# Patient Record
Sex: Male | Born: 1950
Health system: Southern US, Community
[De-identification: ages and names within clinical notes are randomized; demographics above are authoritative.]

## PROBLEM LIST (undated history)

## (undated) DIAGNOSIS — E7800 Pure hypercholesterolemia, unspecified: Secondary | ICD-10-CM

## (undated) DIAGNOSIS — I10 Essential (primary) hypertension: Secondary | ICD-10-CM

## (undated) DIAGNOSIS — K56609 Unspecified intestinal obstruction, unspecified as to partial versus complete obstruction: Secondary | ICD-10-CM

## (undated) DIAGNOSIS — E119 Type 2 diabetes mellitus without complications: Secondary | ICD-10-CM

## (undated) DIAGNOSIS — I4892 Unspecified atrial flutter: Secondary | ICD-10-CM

---

## 2006-06-14 ENCOUNTER — Ambulatory Visit (HOSPITAL_BASED_OUTPATIENT_CLINIC_OR_DEPARTMENT_OTHER): Admission: RE | Admit: 2006-06-14 | Discharge: 2006-06-14 | Payer: Self-pay | Admitting: Orthopedic Surgery

## 2015-11-17 DIAGNOSIS — E119 Type 2 diabetes mellitus without complications: Secondary | ICD-10-CM | POA: Diagnosis not present

## 2015-11-24 DIAGNOSIS — J019 Acute sinusitis, unspecified: Secondary | ICD-10-CM | POA: Diagnosis not present

## 2015-11-24 DIAGNOSIS — Z6829 Body mass index (BMI) 29.0-29.9, adult: Secondary | ICD-10-CM | POA: Diagnosis not present

## 2015-12-19 DIAGNOSIS — R69 Illness, unspecified: Secondary | ICD-10-CM | POA: Diagnosis not present

## 2015-12-28 DIAGNOSIS — R93421 Abnormal radiologic findings on diagnostic imaging of right kidney: Secondary | ICD-10-CM | POA: Diagnosis not present

## 2015-12-28 DIAGNOSIS — N281 Cyst of kidney, acquired: Secondary | ICD-10-CM | POA: Diagnosis not present

## 2015-12-28 DIAGNOSIS — N200 Calculus of kidney: Secondary | ICD-10-CM | POA: Diagnosis not present

## 2015-12-28 DIAGNOSIS — Z87442 Personal history of urinary calculi: Secondary | ICD-10-CM | POA: Diagnosis not present

## 2015-12-28 DIAGNOSIS — M8580 Other specified disorders of bone density and structure, unspecified site: Secondary | ICD-10-CM | POA: Diagnosis not present

## 2015-12-28 DIAGNOSIS — R93422 Abnormal radiologic findings on diagnostic imaging of left kidney: Secondary | ICD-10-CM | POA: Diagnosis not present

## 2015-12-28 DIAGNOSIS — Z09 Encounter for follow-up examination after completed treatment for conditions other than malignant neoplasm: Secondary | ICD-10-CM | POA: Diagnosis not present

## 2015-12-28 DIAGNOSIS — Z6827 Body mass index (BMI) 27.0-27.9, adult: Secondary | ICD-10-CM | POA: Diagnosis not present

## 2015-12-31 DIAGNOSIS — R69 Illness, unspecified: Secondary | ICD-10-CM | POA: Diagnosis not present

## 2016-01-21 DIAGNOSIS — R69 Illness, unspecified: Secondary | ICD-10-CM | POA: Diagnosis not present

## 2016-01-27 DIAGNOSIS — N200 Calculus of kidney: Secondary | ICD-10-CM | POA: Diagnosis not present

## 2016-01-27 DIAGNOSIS — Z1389 Encounter for screening for other disorder: Secondary | ICD-10-CM | POA: Diagnosis not present

## 2016-01-27 DIAGNOSIS — R319 Hematuria, unspecified: Secondary | ICD-10-CM | POA: Diagnosis not present

## 2016-01-28 DIAGNOSIS — R93422 Abnormal radiologic findings on diagnostic imaging of left kidney: Secondary | ICD-10-CM | POA: Diagnosis not present

## 2016-01-28 DIAGNOSIS — N281 Cyst of kidney, acquired: Secondary | ICD-10-CM | POA: Diagnosis not present

## 2016-02-18 DIAGNOSIS — N200 Calculus of kidney: Secondary | ICD-10-CM | POA: Diagnosis not present

## 2016-02-25 DIAGNOSIS — R69 Illness, unspecified: Secondary | ICD-10-CM | POA: Diagnosis not present

## 2016-04-12 DIAGNOSIS — Z7984 Long term (current) use of oral hypoglycemic drugs: Secondary | ICD-10-CM | POA: Diagnosis not present

## 2016-04-12 DIAGNOSIS — M109 Gout, unspecified: Secondary | ICD-10-CM | POA: Diagnosis not present

## 2016-04-12 DIAGNOSIS — I10 Essential (primary) hypertension: Secondary | ICD-10-CM | POA: Diagnosis not present

## 2016-04-12 DIAGNOSIS — N200 Calculus of kidney: Secondary | ICD-10-CM | POA: Diagnosis not present

## 2016-04-12 DIAGNOSIS — K5190 Ulcerative colitis, unspecified, without complications: Secondary | ICD-10-CM | POA: Diagnosis not present

## 2016-04-12 DIAGNOSIS — E119 Type 2 diabetes mellitus without complications: Secondary | ICD-10-CM | POA: Diagnosis not present

## 2016-04-25 DIAGNOSIS — R69 Illness, unspecified: Secondary | ICD-10-CM | POA: Diagnosis not present

## 2016-05-06 DIAGNOSIS — I10 Essential (primary) hypertension: Secondary | ICD-10-CM | POA: Diagnosis not present

## 2016-05-06 DIAGNOSIS — E559 Vitamin D deficiency, unspecified: Secondary | ICD-10-CM | POA: Diagnosis not present

## 2016-05-06 DIAGNOSIS — Z125 Encounter for screening for malignant neoplasm of prostate: Secondary | ICD-10-CM | POA: Diagnosis not present

## 2016-05-06 DIAGNOSIS — E1140 Type 2 diabetes mellitus with diabetic neuropathy, unspecified: Secondary | ICD-10-CM | POA: Diagnosis not present

## 2016-05-06 DIAGNOSIS — Z9181 History of falling: Secondary | ICD-10-CM | POA: Diagnosis not present

## 2016-05-06 DIAGNOSIS — Z1389 Encounter for screening for other disorder: Secondary | ICD-10-CM | POA: Diagnosis not present

## 2016-05-06 DIAGNOSIS — E785 Hyperlipidemia, unspecified: Secondary | ICD-10-CM | POA: Diagnosis not present

## 2016-05-31 DIAGNOSIS — Z8719 Personal history of other diseases of the digestive system: Secondary | ICD-10-CM | POA: Diagnosis not present

## 2016-05-31 DIAGNOSIS — Z6829 Body mass index (BMI) 29.0-29.9, adult: Secondary | ICD-10-CM | POA: Diagnosis not present

## 2016-06-01 DIAGNOSIS — R69 Illness, unspecified: Secondary | ICD-10-CM | POA: Diagnosis not present

## 2016-06-30 DIAGNOSIS — J019 Acute sinusitis, unspecified: Secondary | ICD-10-CM | POA: Diagnosis not present

## 2016-06-30 DIAGNOSIS — Z6830 Body mass index (BMI) 30.0-30.9, adult: Secondary | ICD-10-CM | POA: Diagnosis not present

## 2016-08-10 DIAGNOSIS — N281 Cyst of kidney, acquired: Secondary | ICD-10-CM | POA: Diagnosis not present

## 2016-08-10 DIAGNOSIS — N200 Calculus of kidney: Secondary | ICD-10-CM | POA: Diagnosis not present

## 2016-08-15 DIAGNOSIS — R69 Illness, unspecified: Secondary | ICD-10-CM | POA: Diagnosis not present

## 2016-08-17 DIAGNOSIS — N200 Calculus of kidney: Secondary | ICD-10-CM | POA: Diagnosis not present

## 2016-08-23 DIAGNOSIS — Z9049 Acquired absence of other specified parts of digestive tract: Secondary | ICD-10-CM | POA: Diagnosis not present

## 2016-08-23 DIAGNOSIS — K429 Umbilical hernia without obstruction or gangrene: Secondary | ICD-10-CM | POA: Diagnosis not present

## 2016-08-23 DIAGNOSIS — N202 Calculus of kidney with calculus of ureter: Secondary | ICD-10-CM | POA: Diagnosis not present

## 2016-08-24 DIAGNOSIS — N200 Calculus of kidney: Secondary | ICD-10-CM | POA: Diagnosis not present

## 2016-10-20 DIAGNOSIS — N200 Calculus of kidney: Secondary | ICD-10-CM | POA: Diagnosis not present

## 2016-11-02 DIAGNOSIS — I10 Essential (primary) hypertension: Secondary | ICD-10-CM | POA: Diagnosis not present

## 2016-11-02 DIAGNOSIS — N183 Chronic kidney disease, stage 3 (moderate): Secondary | ICD-10-CM | POA: Diagnosis not present

## 2016-11-02 DIAGNOSIS — E1122 Type 2 diabetes mellitus with diabetic chronic kidney disease: Secondary | ICD-10-CM | POA: Diagnosis not present

## 2016-11-02 DIAGNOSIS — Z7984 Long term (current) use of oral hypoglycemic drugs: Secondary | ICD-10-CM | POA: Diagnosis not present

## 2016-11-02 DIAGNOSIS — M109 Gout, unspecified: Secondary | ICD-10-CM | POA: Diagnosis not present

## 2016-11-02 DIAGNOSIS — K5190 Ulcerative colitis, unspecified, without complications: Secondary | ICD-10-CM | POA: Diagnosis not present

## 2016-11-02 DIAGNOSIS — I129 Hypertensive chronic kidney disease with stage 1 through stage 4 chronic kidney disease, or unspecified chronic kidney disease: Secondary | ICD-10-CM | POA: Diagnosis not present

## 2016-11-02 DIAGNOSIS — E119 Type 2 diabetes mellitus without complications: Secondary | ICD-10-CM | POA: Diagnosis not present

## 2016-11-02 DIAGNOSIS — N200 Calculus of kidney: Secondary | ICD-10-CM | POA: Diagnosis not present

## 2016-11-10 DIAGNOSIS — Z23 Encounter for immunization: Secondary | ICD-10-CM | POA: Diagnosis not present

## 2016-11-10 DIAGNOSIS — Z6829 Body mass index (BMI) 29.0-29.9, adult: Secondary | ICD-10-CM | POA: Diagnosis not present

## 2016-11-10 DIAGNOSIS — E1140 Type 2 diabetes mellitus with diabetic neuropathy, unspecified: Secondary | ICD-10-CM | POA: Diagnosis not present

## 2016-11-10 DIAGNOSIS — N183 Chronic kidney disease, stage 3 (moderate): Secondary | ICD-10-CM | POA: Diagnosis not present

## 2016-11-10 DIAGNOSIS — E785 Hyperlipidemia, unspecified: Secondary | ICD-10-CM | POA: Diagnosis not present

## 2016-11-10 DIAGNOSIS — I129 Hypertensive chronic kidney disease with stage 1 through stage 4 chronic kidney disease, or unspecified chronic kidney disease: Secondary | ICD-10-CM | POA: Diagnosis not present

## 2016-11-10 DIAGNOSIS — E559 Vitamin D deficiency, unspecified: Secondary | ICD-10-CM | POA: Diagnosis not present

## 2016-11-12 DIAGNOSIS — R69 Illness, unspecified: Secondary | ICD-10-CM | POA: Diagnosis not present

## 2016-11-21 DIAGNOSIS — Z6829 Body mass index (BMI) 29.0-29.9, adult: Secondary | ICD-10-CM | POA: Diagnosis not present

## 2016-11-21 DIAGNOSIS — J019 Acute sinusitis, unspecified: Secondary | ICD-10-CM | POA: Diagnosis not present

## 2016-11-29 DIAGNOSIS — H25813 Combined forms of age-related cataract, bilateral: Secondary | ICD-10-CM | POA: Diagnosis not present

## 2016-11-29 DIAGNOSIS — E119 Type 2 diabetes mellitus without complications: Secondary | ICD-10-CM | POA: Diagnosis not present

## 2017-01-26 DIAGNOSIS — R69 Illness, unspecified: Secondary | ICD-10-CM | POA: Diagnosis not present

## 2017-02-15 DIAGNOSIS — R69 Illness, unspecified: Secondary | ICD-10-CM | POA: Diagnosis not present

## 2017-04-18 DIAGNOSIS — R69 Illness, unspecified: Secondary | ICD-10-CM | POA: Diagnosis not present

## 2017-04-25 DIAGNOSIS — R69 Illness, unspecified: Secondary | ICD-10-CM | POA: Diagnosis not present

## 2017-05-15 DIAGNOSIS — N183 Chronic kidney disease, stage 3 (moderate): Secondary | ICD-10-CM | POA: Diagnosis not present

## 2017-05-15 DIAGNOSIS — E785 Hyperlipidemia, unspecified: Secondary | ICD-10-CM | POA: Diagnosis not present

## 2017-05-15 DIAGNOSIS — Z1331 Encounter for screening for depression: Secondary | ICD-10-CM | POA: Diagnosis not present

## 2017-05-15 DIAGNOSIS — Z125 Encounter for screening for malignant neoplasm of prostate: Secondary | ICD-10-CM | POA: Diagnosis not present

## 2017-05-15 DIAGNOSIS — E1140 Type 2 diabetes mellitus with diabetic neuropathy, unspecified: Secondary | ICD-10-CM | POA: Diagnosis not present

## 2017-05-15 DIAGNOSIS — I129 Hypertensive chronic kidney disease with stage 1 through stage 4 chronic kidney disease, or unspecified chronic kidney disease: Secondary | ICD-10-CM | POA: Diagnosis not present

## 2017-05-15 DIAGNOSIS — Z9181 History of falling: Secondary | ICD-10-CM | POA: Diagnosis not present

## 2017-05-15 DIAGNOSIS — Z23 Encounter for immunization: Secondary | ICD-10-CM | POA: Diagnosis not present

## 2017-05-15 DIAGNOSIS — Z6829 Body mass index (BMI) 29.0-29.9, adult: Secondary | ICD-10-CM | POA: Diagnosis not present

## 2017-05-15 DIAGNOSIS — E1165 Type 2 diabetes mellitus with hyperglycemia: Secondary | ICD-10-CM | POA: Diagnosis not present

## 2017-05-15 DIAGNOSIS — E559 Vitamin D deficiency, unspecified: Secondary | ICD-10-CM | POA: Diagnosis not present

## 2017-05-29 DIAGNOSIS — E785 Hyperlipidemia, unspecified: Secondary | ICD-10-CM | POA: Diagnosis not present

## 2017-05-29 DIAGNOSIS — Z125 Encounter for screening for malignant neoplasm of prostate: Secondary | ICD-10-CM | POA: Diagnosis not present

## 2017-05-29 DIAGNOSIS — Z9181 History of falling: Secondary | ICD-10-CM | POA: Diagnosis not present

## 2017-05-29 DIAGNOSIS — Z1331 Encounter for screening for depression: Secondary | ICD-10-CM | POA: Diagnosis not present

## 2017-05-29 DIAGNOSIS — Z131 Encounter for screening for diabetes mellitus: Secondary | ICD-10-CM | POA: Diagnosis not present

## 2017-05-29 DIAGNOSIS — Z0000 Encounter for general adult medical examination without abnormal findings: Secondary | ICD-10-CM | POA: Diagnosis not present

## 2017-06-06 DIAGNOSIS — Z6829 Body mass index (BMI) 29.0-29.9, adult: Secondary | ICD-10-CM | POA: Diagnosis not present

## 2017-06-06 DIAGNOSIS — Z8719 Personal history of other diseases of the digestive system: Secondary | ICD-10-CM | POA: Diagnosis not present

## 2017-07-14 DIAGNOSIS — R69 Illness, unspecified: Secondary | ICD-10-CM | POA: Diagnosis not present

## 2017-08-03 DIAGNOSIS — R69 Illness, unspecified: Secondary | ICD-10-CM | POA: Diagnosis not present

## 2017-09-05 DIAGNOSIS — R69 Illness, unspecified: Secondary | ICD-10-CM | POA: Diagnosis not present

## 2017-10-13 DIAGNOSIS — R69 Illness, unspecified: Secondary | ICD-10-CM | POA: Diagnosis not present

## 2017-11-13 DIAGNOSIS — Z6829 Body mass index (BMI) 29.0-29.9, adult: Secondary | ICD-10-CM | POA: Diagnosis not present

## 2017-11-13 DIAGNOSIS — E1140 Type 2 diabetes mellitus with diabetic neuropathy, unspecified: Secondary | ICD-10-CM | POA: Diagnosis not present

## 2017-11-13 DIAGNOSIS — N183 Chronic kidney disease, stage 3 (moderate): Secondary | ICD-10-CM | POA: Diagnosis not present

## 2017-11-13 DIAGNOSIS — I129 Hypertensive chronic kidney disease with stage 1 through stage 4 chronic kidney disease, or unspecified chronic kidney disease: Secondary | ICD-10-CM | POA: Diagnosis not present

## 2017-11-13 DIAGNOSIS — E559 Vitamin D deficiency, unspecified: Secondary | ICD-10-CM | POA: Diagnosis not present

## 2017-11-13 DIAGNOSIS — E785 Hyperlipidemia, unspecified: Secondary | ICD-10-CM | POA: Diagnosis not present

## 2017-11-23 DIAGNOSIS — R69 Illness, unspecified: Secondary | ICD-10-CM | POA: Diagnosis not present

## 2017-12-08 DIAGNOSIS — E119 Type 2 diabetes mellitus without complications: Secondary | ICD-10-CM | POA: Diagnosis not present

## 2017-12-08 DIAGNOSIS — H25813 Combined forms of age-related cataract, bilateral: Secondary | ICD-10-CM | POA: Diagnosis not present

## 2017-12-14 DIAGNOSIS — E1140 Type 2 diabetes mellitus with diabetic neuropathy, unspecified: Secondary | ICD-10-CM | POA: Diagnosis not present

## 2017-12-14 DIAGNOSIS — E1165 Type 2 diabetes mellitus with hyperglycemia: Secondary | ICD-10-CM | POA: Diagnosis not present

## 2018-02-05 DIAGNOSIS — R69 Illness, unspecified: Secondary | ICD-10-CM | POA: Diagnosis not present

## 2018-02-09 DIAGNOSIS — R69 Illness, unspecified: Secondary | ICD-10-CM | POA: Diagnosis not present

## 2018-03-29 DIAGNOSIS — R69 Illness, unspecified: Secondary | ICD-10-CM | POA: Diagnosis not present

## 2018-05-18 DIAGNOSIS — Z125 Encounter for screening for malignant neoplasm of prostate: Secondary | ICD-10-CM | POA: Diagnosis not present

## 2018-05-18 DIAGNOSIS — N183 Chronic kidney disease, stage 3 (moderate): Secondary | ICD-10-CM | POA: Diagnosis not present

## 2018-05-18 DIAGNOSIS — E785 Hyperlipidemia, unspecified: Secondary | ICD-10-CM | POA: Diagnosis not present

## 2018-05-18 DIAGNOSIS — E1140 Type 2 diabetes mellitus with diabetic neuropathy, unspecified: Secondary | ICD-10-CM | POA: Diagnosis not present

## 2018-05-18 DIAGNOSIS — E559 Vitamin D deficiency, unspecified: Secondary | ICD-10-CM | POA: Diagnosis not present

## 2018-05-21 DIAGNOSIS — E1140 Type 2 diabetes mellitus with diabetic neuropathy, unspecified: Secondary | ICD-10-CM | POA: Diagnosis not present

## 2018-05-21 DIAGNOSIS — E559 Vitamin D deficiency, unspecified: Secondary | ICD-10-CM | POA: Diagnosis not present

## 2018-05-21 DIAGNOSIS — N183 Chronic kidney disease, stage 3 (moderate): Secondary | ICD-10-CM | POA: Diagnosis not present

## 2018-05-21 DIAGNOSIS — I129 Hypertensive chronic kidney disease with stage 1 through stage 4 chronic kidney disease, or unspecified chronic kidney disease: Secondary | ICD-10-CM | POA: Diagnosis not present

## 2018-05-21 DIAGNOSIS — E785 Hyperlipidemia, unspecified: Secondary | ICD-10-CM | POA: Diagnosis not present

## 2018-05-21 DIAGNOSIS — Z6830 Body mass index (BMI) 30.0-30.9, adult: Secondary | ICD-10-CM | POA: Diagnosis not present

## 2018-05-28 DIAGNOSIS — M25521 Pain in right elbow: Secondary | ICD-10-CM | POA: Diagnosis not present

## 2018-05-31 DIAGNOSIS — M25521 Pain in right elbow: Secondary | ICD-10-CM | POA: Diagnosis not present

## 2018-05-31 DIAGNOSIS — S59901A Unspecified injury of right elbow, initial encounter: Secondary | ICD-10-CM | POA: Diagnosis not present

## 2018-05-31 DIAGNOSIS — M7989 Other specified soft tissue disorders: Secondary | ICD-10-CM | POA: Diagnosis not present

## 2018-06-06 DIAGNOSIS — M25521 Pain in right elbow: Secondary | ICD-10-CM | POA: Diagnosis not present

## 2018-06-06 DIAGNOSIS — R69 Illness, unspecified: Secondary | ICD-10-CM | POA: Diagnosis not present

## 2018-06-12 DIAGNOSIS — Z8719 Personal history of other diseases of the digestive system: Secondary | ICD-10-CM | POA: Diagnosis not present

## 2018-06-12 DIAGNOSIS — Z6829 Body mass index (BMI) 29.0-29.9, adult: Secondary | ICD-10-CM | POA: Diagnosis not present

## 2018-06-25 DIAGNOSIS — R69 Illness, unspecified: Secondary | ICD-10-CM | POA: Diagnosis not present

## 2018-08-03 DIAGNOSIS — R69 Illness, unspecified: Secondary | ICD-10-CM | POA: Diagnosis not present

## 2018-08-20 DIAGNOSIS — R69 Illness, unspecified: Secondary | ICD-10-CM | POA: Diagnosis not present

## 2018-09-21 DIAGNOSIS — R69 Illness, unspecified: Secondary | ICD-10-CM | POA: Diagnosis not present

## 2018-11-23 DIAGNOSIS — N183 Chronic kidney disease, stage 3 (moderate): Secondary | ICD-10-CM | POA: Diagnosis not present

## 2018-11-23 DIAGNOSIS — E559 Vitamin D deficiency, unspecified: Secondary | ICD-10-CM | POA: Diagnosis not present

## 2018-11-23 DIAGNOSIS — E785 Hyperlipidemia, unspecified: Secondary | ICD-10-CM | POA: Diagnosis not present

## 2018-11-23 DIAGNOSIS — E1140 Type 2 diabetes mellitus with diabetic neuropathy, unspecified: Secondary | ICD-10-CM | POA: Diagnosis not present

## 2018-11-26 DIAGNOSIS — Z9181 History of falling: Secondary | ICD-10-CM | POA: Diagnosis not present

## 2018-11-26 DIAGNOSIS — Z1331 Encounter for screening for depression: Secondary | ICD-10-CM | POA: Diagnosis not present

## 2018-11-26 DIAGNOSIS — Z0000 Encounter for general adult medical examination without abnormal findings: Secondary | ICD-10-CM | POA: Diagnosis not present

## 2018-11-26 DIAGNOSIS — Z1339 Encounter for screening examination for other mental health and behavioral disorders: Secondary | ICD-10-CM | POA: Diagnosis not present

## 2018-11-26 DIAGNOSIS — Z6830 Body mass index (BMI) 30.0-30.9, adult: Secondary | ICD-10-CM | POA: Diagnosis not present

## 2018-11-26 DIAGNOSIS — E1140 Type 2 diabetes mellitus with diabetic neuropathy, unspecified: Secondary | ICD-10-CM | POA: Diagnosis not present

## 2018-11-26 DIAGNOSIS — I129 Hypertensive chronic kidney disease with stage 1 through stage 4 chronic kidney disease, or unspecified chronic kidney disease: Secondary | ICD-10-CM | POA: Diagnosis not present

## 2018-11-26 DIAGNOSIS — E785 Hyperlipidemia, unspecified: Secondary | ICD-10-CM | POA: Diagnosis not present

## 2018-11-26 DIAGNOSIS — E559 Vitamin D deficiency, unspecified: Secondary | ICD-10-CM | POA: Diagnosis not present

## 2018-11-26 DIAGNOSIS — E669 Obesity, unspecified: Secondary | ICD-10-CM | POA: Diagnosis not present

## 2018-12-20 DIAGNOSIS — E119 Type 2 diabetes mellitus without complications: Secondary | ICD-10-CM | POA: Diagnosis not present

## 2019-03-11 DIAGNOSIS — R69 Illness, unspecified: Secondary | ICD-10-CM | POA: Diagnosis not present

## 2019-04-18 DIAGNOSIS — R69 Illness, unspecified: Secondary | ICD-10-CM | POA: Diagnosis not present

## 2019-05-10 DIAGNOSIS — Z23 Encounter for immunization: Secondary | ICD-10-CM | POA: Diagnosis not present

## 2019-05-31 DIAGNOSIS — E559 Vitamin D deficiency, unspecified: Secondary | ICD-10-CM | POA: Diagnosis not present

## 2019-05-31 DIAGNOSIS — Z125 Encounter for screening for malignant neoplasm of prostate: Secondary | ICD-10-CM | POA: Diagnosis not present

## 2019-05-31 DIAGNOSIS — N183 Chronic kidney disease, stage 3 unspecified: Secondary | ICD-10-CM | POA: Diagnosis not present

## 2019-05-31 DIAGNOSIS — E785 Hyperlipidemia, unspecified: Secondary | ICD-10-CM | POA: Diagnosis not present

## 2019-05-31 DIAGNOSIS — E1140 Type 2 diabetes mellitus with diabetic neuropathy, unspecified: Secondary | ICD-10-CM | POA: Diagnosis not present

## 2019-06-03 DIAGNOSIS — E1165 Type 2 diabetes mellitus with hyperglycemia: Secondary | ICD-10-CM | POA: Diagnosis not present

## 2019-06-03 DIAGNOSIS — Z139 Encounter for screening, unspecified: Secondary | ICD-10-CM | POA: Diagnosis not present

## 2019-06-03 DIAGNOSIS — Z6826 Body mass index (BMI) 26.0-26.9, adult: Secondary | ICD-10-CM | POA: Diagnosis not present

## 2019-06-03 DIAGNOSIS — E559 Vitamin D deficiency, unspecified: Secondary | ICD-10-CM | POA: Diagnosis not present

## 2019-06-03 DIAGNOSIS — I129 Hypertensive chronic kidney disease with stage 1 through stage 4 chronic kidney disease, or unspecified chronic kidney disease: Secondary | ICD-10-CM | POA: Diagnosis not present

## 2019-06-03 DIAGNOSIS — E785 Hyperlipidemia, unspecified: Secondary | ICD-10-CM | POA: Diagnosis not present

## 2019-06-03 DIAGNOSIS — N183 Chronic kidney disease, stage 3 unspecified: Secondary | ICD-10-CM | POA: Diagnosis not present

## 2019-06-03 DIAGNOSIS — E1140 Type 2 diabetes mellitus with diabetic neuropathy, unspecified: Secondary | ICD-10-CM | POA: Diagnosis not present

## 2019-06-17 DIAGNOSIS — Z8719 Personal history of other diseases of the digestive system: Secondary | ICD-10-CM | POA: Diagnosis not present

## 2019-06-17 DIAGNOSIS — Z6825 Body mass index (BMI) 25.0-25.9, adult: Secondary | ICD-10-CM | POA: Diagnosis not present

## 2019-09-16 DIAGNOSIS — M79671 Pain in right foot: Secondary | ICD-10-CM | POA: Diagnosis not present

## 2019-09-16 DIAGNOSIS — R309 Painful micturition, unspecified: Secondary | ICD-10-CM | POA: Diagnosis not present

## 2019-09-18 DIAGNOSIS — R1032 Left lower quadrant pain: Secondary | ICD-10-CM | POA: Diagnosis not present

## 2019-09-20 DIAGNOSIS — S76212A Strain of adductor muscle, fascia and tendon of left thigh, initial encounter: Secondary | ICD-10-CM | POA: Diagnosis not present

## 2019-09-24 DIAGNOSIS — Z20828 Contact with and (suspected) exposure to other viral communicable diseases: Secondary | ICD-10-CM | POA: Diagnosis not present

## 2019-09-24 DIAGNOSIS — K4090 Unilateral inguinal hernia, without obstruction or gangrene, not specified as recurrent: Secondary | ICD-10-CM | POA: Diagnosis not present

## 2019-09-24 DIAGNOSIS — J3489 Other specified disorders of nose and nasal sinuses: Secondary | ICD-10-CM | POA: Diagnosis not present

## 2019-09-24 DIAGNOSIS — R1030 Lower abdominal pain, unspecified: Secondary | ICD-10-CM | POA: Diagnosis not present

## 2019-09-26 DIAGNOSIS — I129 Hypertensive chronic kidney disease with stage 1 through stage 4 chronic kidney disease, or unspecified chronic kidney disease: Secondary | ICD-10-CM | POA: Diagnosis not present

## 2019-09-26 DIAGNOSIS — G4733 Obstructive sleep apnea (adult) (pediatric): Secondary | ICD-10-CM | POA: Diagnosis not present

## 2019-09-26 DIAGNOSIS — K589 Irritable bowel syndrome without diarrhea: Secondary | ICD-10-CM | POA: Diagnosis not present

## 2019-09-26 DIAGNOSIS — E1122 Type 2 diabetes mellitus with diabetic chronic kidney disease: Secondary | ICD-10-CM | POA: Diagnosis not present

## 2019-09-26 DIAGNOSIS — Z794 Long term (current) use of insulin: Secondary | ICD-10-CM | POA: Diagnosis not present

## 2019-09-26 DIAGNOSIS — K219 Gastro-esophageal reflux disease without esophagitis: Secondary | ICD-10-CM | POA: Diagnosis not present

## 2019-09-26 DIAGNOSIS — E119 Type 2 diabetes mellitus without complications: Secondary | ICD-10-CM | POA: Diagnosis not present

## 2019-09-26 DIAGNOSIS — Z7982 Long term (current) use of aspirin: Secondary | ICD-10-CM | POA: Diagnosis not present

## 2019-09-26 DIAGNOSIS — K4090 Unilateral inguinal hernia, without obstruction or gangrene, not specified as recurrent: Secondary | ICD-10-CM | POA: Diagnosis not present

## 2019-09-26 DIAGNOSIS — N183 Chronic kidney disease, stage 3 unspecified: Secondary | ICD-10-CM | POA: Diagnosis not present

## 2019-09-26 DIAGNOSIS — Z79899 Other long term (current) drug therapy: Secondary | ICD-10-CM | POA: Diagnosis not present

## 2019-09-26 DIAGNOSIS — D176 Benign lipomatous neoplasm of spermatic cord: Secondary | ICD-10-CM | POA: Diagnosis not present

## 2019-10-04 DIAGNOSIS — Z09 Encounter for follow-up examination after completed treatment for conditions other than malignant neoplasm: Secondary | ICD-10-CM | POA: Diagnosis not present

## 2019-10-16 DIAGNOSIS — R69 Illness, unspecified: Secondary | ICD-10-CM | POA: Diagnosis not present

## 2019-12-03 DIAGNOSIS — Z9181 History of falling: Secondary | ICD-10-CM | POA: Diagnosis not present

## 2019-12-03 DIAGNOSIS — E785 Hyperlipidemia, unspecified: Secondary | ICD-10-CM | POA: Diagnosis not present

## 2019-12-03 DIAGNOSIS — N183 Chronic kidney disease, stage 3 unspecified: Secondary | ICD-10-CM | POA: Diagnosis not present

## 2019-12-03 DIAGNOSIS — E1140 Type 2 diabetes mellitus with diabetic neuropathy, unspecified: Secondary | ICD-10-CM | POA: Diagnosis not present

## 2019-12-03 DIAGNOSIS — Z1331 Encounter for screening for depression: Secondary | ICD-10-CM | POA: Diagnosis not present

## 2019-12-03 DIAGNOSIS — Z0000 Encounter for general adult medical examination without abnormal findings: Secondary | ICD-10-CM | POA: Diagnosis not present

## 2019-12-05 DIAGNOSIS — Z6827 Body mass index (BMI) 27.0-27.9, adult: Secondary | ICD-10-CM | POA: Diagnosis not present

## 2019-12-05 DIAGNOSIS — I129 Hypertensive chronic kidney disease with stage 1 through stage 4 chronic kidney disease, or unspecified chronic kidney disease: Secondary | ICD-10-CM | POA: Diagnosis not present

## 2019-12-05 DIAGNOSIS — E785 Hyperlipidemia, unspecified: Secondary | ICD-10-CM | POA: Diagnosis not present

## 2019-12-05 DIAGNOSIS — N183 Chronic kidney disease, stage 3 unspecified: Secondary | ICD-10-CM | POA: Diagnosis not present

## 2019-12-05 DIAGNOSIS — E1165 Type 2 diabetes mellitus with hyperglycemia: Secondary | ICD-10-CM | POA: Diagnosis not present

## 2019-12-05 DIAGNOSIS — E559 Vitamin D deficiency, unspecified: Secondary | ICD-10-CM | POA: Diagnosis not present

## 2019-12-05 DIAGNOSIS — E1140 Type 2 diabetes mellitus with diabetic neuropathy, unspecified: Secondary | ICD-10-CM | POA: Diagnosis not present

## 2019-12-24 DIAGNOSIS — H25813 Combined forms of age-related cataract, bilateral: Secondary | ICD-10-CM | POA: Diagnosis not present

## 2019-12-24 DIAGNOSIS — E119 Type 2 diabetes mellitus without complications: Secondary | ICD-10-CM | POA: Diagnosis not present

## 2020-04-03 DIAGNOSIS — Z20822 Contact with and (suspected) exposure to covid-19: Secondary | ICD-10-CM | POA: Diagnosis not present

## 2020-08-13 ENCOUNTER — Inpatient Hospital Stay (HOSPITAL_COMMUNITY)
Admission: EM | Admit: 2020-08-13 | Discharge: 2020-08-18 | DRG: 176 | Disposition: A | Discharge status: Home / Self Care | Payer: Medicare HMO | Attending: Internal Medicine | Admitting: Internal Medicine

## 2020-08-13 ENCOUNTER — Emergency Department (HOSPITAL_COMMUNITY): Payer: Medicare HMO

## 2020-08-13 ENCOUNTER — Encounter (HOSPITAL_COMMUNITY): Payer: Self-pay | Admitting: Emergency Medicine

## 2020-08-13 DIAGNOSIS — I2721 Secondary pulmonary arterial hypertension: Secondary | ICD-10-CM | POA: Diagnosis present

## 2020-08-13 DIAGNOSIS — E7800 Pure hypercholesterolemia, unspecified: Secondary | ICD-10-CM | POA: Diagnosis present

## 2020-08-13 DIAGNOSIS — E119 Type 2 diabetes mellitus without complications: Secondary | ICD-10-CM

## 2020-08-13 DIAGNOSIS — M549 Dorsalgia, unspecified: Secondary | ICD-10-CM | POA: Diagnosis not present

## 2020-08-13 DIAGNOSIS — I10 Essential (primary) hypertension: Secondary | ICD-10-CM | POA: Diagnosis not present

## 2020-08-13 DIAGNOSIS — N179 Acute kidney failure, unspecified: Secondary | ICD-10-CM

## 2020-08-13 DIAGNOSIS — Z79899 Other long term (current) drug therapy: Secondary | ICD-10-CM

## 2020-08-13 DIAGNOSIS — J9811 Atelectasis: Secondary | ICD-10-CM | POA: Diagnosis not present

## 2020-08-13 DIAGNOSIS — Z9049 Acquired absence of other specified parts of digestive tract: Secondary | ICD-10-CM

## 2020-08-13 DIAGNOSIS — R079 Chest pain, unspecified: Secondary | ICD-10-CM | POA: Diagnosis not present

## 2020-08-13 DIAGNOSIS — E785 Hyperlipidemia, unspecified: Secondary | ICD-10-CM | POA: Diagnosis present

## 2020-08-13 DIAGNOSIS — I2699 Other pulmonary embolism without acute cor pulmonale: Secondary | ICD-10-CM

## 2020-08-13 DIAGNOSIS — R55 Syncope and collapse: Secondary | ICD-10-CM

## 2020-08-13 DIAGNOSIS — I4891 Unspecified atrial fibrillation: Secondary | ICD-10-CM | POA: Diagnosis not present

## 2020-08-13 DIAGNOSIS — Z20822 Contact with and (suspected) exposure to covid-19: Secondary | ICD-10-CM | POA: Diagnosis present

## 2020-08-13 DIAGNOSIS — Z888 Allergy status to other drugs, medicaments and biological substances status: Secondary | ICD-10-CM

## 2020-08-13 DIAGNOSIS — U099 Post covid-19 condition, unspecified: Secondary | ICD-10-CM | POA: Diagnosis present

## 2020-08-13 DIAGNOSIS — I483 Typical atrial flutter: Secondary | ICD-10-CM | POA: Diagnosis not present

## 2020-08-13 DIAGNOSIS — E1122 Type 2 diabetes mellitus with diabetic chronic kidney disease: Secondary | ICD-10-CM | POA: Diagnosis present

## 2020-08-13 DIAGNOSIS — U071 COVID-19: Secondary | ICD-10-CM | POA: Diagnosis not present

## 2020-08-13 DIAGNOSIS — Z8719 Personal history of other diseases of the digestive system: Secondary | ICD-10-CM

## 2020-08-13 DIAGNOSIS — I4892 Unspecified atrial flutter: Secondary | ICD-10-CM | POA: Diagnosis present

## 2020-08-13 DIAGNOSIS — Z743 Need for continuous supervision: Secondary | ICD-10-CM | POA: Diagnosis not present

## 2020-08-13 DIAGNOSIS — Z8249 Family history of ischemic heart disease and other diseases of the circulatory system: Secondary | ICD-10-CM

## 2020-08-13 DIAGNOSIS — I4430 Unspecified atrioventricular block: Secondary | ICD-10-CM | POA: Diagnosis present

## 2020-08-13 DIAGNOSIS — R52 Pain, unspecified: Secondary | ICD-10-CM | POA: Diagnosis not present

## 2020-08-13 DIAGNOSIS — M109 Gout, unspecified: Secondary | ICD-10-CM | POA: Diagnosis present

## 2020-08-13 DIAGNOSIS — D72829 Elevated white blood cell count, unspecified: Secondary | ICD-10-CM | POA: Diagnosis present

## 2020-08-13 DIAGNOSIS — Z87442 Personal history of urinary calculi: Secondary | ICD-10-CM

## 2020-08-13 DIAGNOSIS — I2692 Saddle embolus of pulmonary artery without acute cor pulmonale: Secondary | ICD-10-CM | POA: Diagnosis not present

## 2020-08-13 DIAGNOSIS — N183 Chronic kidney disease, stage 3 unspecified: Secondary | ICD-10-CM | POA: Diagnosis present

## 2020-08-13 DIAGNOSIS — I130 Hypertensive heart and chronic kidney disease with heart failure and stage 1 through stage 4 chronic kidney disease, or unspecified chronic kidney disease: Secondary | ICD-10-CM | POA: Diagnosis present

## 2020-08-13 HISTORY — DX: Type 2 diabetes mellitus without complications: E11.9

## 2020-08-13 HISTORY — DX: Essential (primary) hypertension: I10

## 2020-08-13 HISTORY — DX: Pure hypercholesterolemia, unspecified: E78.00

## 2020-08-13 LAB — CBC
HCT: 49.9 % (ref 39.0–52.0)
Hemoglobin: 16.5 g/dL (ref 13.0–17.0)
MCH: 28.9 pg (ref 26.0–34.0)
MCHC: 33.1 g/dL (ref 30.0–36.0)
MCV: 87.4 fL (ref 80.0–100.0)
Platelets: 141 10*3/uL — ABNORMAL LOW (ref 150–400)
RBC: 5.71 MIL/uL (ref 4.22–5.81)
RDW: 13.4 % (ref 11.5–15.5)
WBC: 13.6 10*3/uL — ABNORMAL HIGH (ref 4.0–10.5)
nRBC: 0 % (ref 0.0–0.2)

## 2020-08-13 LAB — CBG MONITORING, ED: Glucose-Capillary: 142 mg/dL — ABNORMAL HIGH (ref 70–99)

## 2020-08-13 LAB — BASIC METABOLIC PANEL
Anion gap: 13 (ref 5–15)
BUN: 26 mg/dL — ABNORMAL HIGH (ref 8–23)
CO2: 24 mmol/L (ref 22–32)
Calcium: 9.5 mg/dL (ref 8.9–10.3)
Chloride: 101 mmol/L (ref 98–111)
Creatinine, Ser: 1.74 mg/dL — ABNORMAL HIGH (ref 0.61–1.24)
GFR, Estimated: 42 mL/min — ABNORMAL LOW (ref >60)
Glucose, Bld: 159 mg/dL — ABNORMAL HIGH (ref 70–99)
Potassium: 3.9 mmol/L (ref 3.5–5.1)
Sodium: 138 mmol/L (ref 135–145)

## 2020-08-13 LAB — TROPONIN I (HIGH SENSITIVITY)
Troponin I (High Sensitivity): 59 ng/L — ABNORMAL HIGH (ref <18)
Troponin I (High Sensitivity): 156 ng/L (ref <18)
Troponin I (High Sensitivity): 260 ng/L (ref <18)

## 2020-08-13 LAB — SARS CORONAVIRUS 2 BY RT PCR (HOSPITAL ORDER, PERFORMED IN ~~LOC~~ HOSPITAL LAB): SARS Coronavirus 2: POSITIVE — AB

## 2020-08-13 MED ORDER — ACETAMINOPHEN 325 MG PO TABS: 650.0000 mg | ORAL_TABLET | ORAL | Status: DC | PRN

## 2020-08-13 MED ORDER — ONDANSETRON HCL 4 MG/2ML IJ SOLN: 4.0000 mg | Freq: Four times a day (QID) | INTRAMUSCULAR | Status: DC | PRN

## 2020-08-13 MED ORDER — INSULIN GLARGINE 100 UNIT/ML ~~LOC~~ SOLN
15.0000 [IU] | Freq: Every day | SUBCUTANEOUS | Status: DC
  Administered 2020-08-13 – 2020-08-17 (×5): 15 [IU] via SUBCUTANEOUS
  Filled 2020-08-13 (×7): qty 0.15

## 2020-08-13 MED ORDER — INSULIN ASPART 100 UNIT/ML ~~LOC~~ SOLN: 0.0000 [IU] | Freq: Every day | SUBCUTANEOUS | Status: DC

## 2020-08-13 MED ORDER — HEPARIN BOLUS VIA INFUSION
4500.0000 [IU] | Freq: Once | INTRAVENOUS | Status: AC
  Administered 2020-08-13: 4500 [IU] via INTRAVENOUS
  Filled 2020-08-13: qty 4500

## 2020-08-13 MED ORDER — HEPARIN (PORCINE) 25000 UT/250ML-% IV SOLN
1500.0000 [IU]/h | INTRAVENOUS | Status: AC
  Administered 2020-08-13 – 2020-08-14 (×2): 1300 [IU]/h via INTRAVENOUS
  Administered 2020-08-15: 1350 [IU]/h via INTRAVENOUS
  Administered 2020-08-16 (×2): 1500 [IU]/h via INTRAVENOUS
  Filled 2020-08-13 (×6): qty 250

## 2020-08-13 MED ORDER — INSULIN ASPART 100 UNIT/ML ~~LOC~~ SOLN
0.0000 [IU] | Freq: Three times a day (TID) | SUBCUTANEOUS | Status: DC
  Administered 2020-08-14: 2 [IU] via SUBCUTANEOUS
  Administered 2020-08-15: 3 [IU] via SUBCUTANEOUS
  Administered 2020-08-16 – 2020-08-17 (×3): 2 [IU] via SUBCUTANEOUS
  Administered 2020-08-17: 3 [IU] via SUBCUTANEOUS

## 2020-08-13 MED ORDER — EZETIMIBE 10 MG PO TABS
10.0000 mg | ORAL_TABLET | Freq: Every day | ORAL | Status: DC
  Administered 2020-08-14 – 2020-08-18 (×5): 10 mg via ORAL
  Filled 2020-08-13 (×5): qty 1

## 2020-08-13 MED ORDER — PRAVASTATIN SODIUM 40 MG PO TABS
20.0000 mg | ORAL_TABLET | ORAL | Status: DC
  Administered 2020-08-16: 20 mg via ORAL
  Filled 2020-08-13: qty 2

## 2020-08-13 MED ORDER — PANTOPRAZOLE SODIUM 40 MG PO TBEC
40.0000 mg | DELAYED_RELEASE_TABLET | Freq: Every day | ORAL | Status: DC
  Administered 2020-08-14 – 2020-08-18 (×5): 40 mg via ORAL
  Filled 2020-08-13 (×5): qty 1

## 2020-08-13 MED ORDER — LACTATED RINGERS IV SOLN: INTRAVENOUS | Status: AC

## 2020-08-13 NOTE — ED Notes (Addendum)
Did not do clinical pathway and patient education. Accidentally clicked off

## 2020-08-13 NOTE — ED Triage Notes (Signed)
Pt was outside walking in a grave yard for exercise and suddenly became to feel faint, diaphoretic and laid down and called ems. Unsure if he had a syncope eent on ems arrival he was awake but pale and very weak and found to be in a flutter rate 100-140's. No history.

## 2020-08-13 NOTE — ED Notes (Signed)
PT HAS DOCUMENTATION FROM [facility] ON 07/20/20 OF POSITIVE COVID. HE IS PAST ISOLATION STATUS. Modena Nunnery, MD MADE AWARE OF THE SAME.

## 2020-08-13 NOTE — Progress Notes (Signed)
ANTICOAGULATION CONSULT NOTE - Initial Consult  Pharmacy Consult for heparin Indication: atrial fibrillation  Allergies  Allergen Reactions   Indomethacin Diarrhea    Other reaction(s): Other (See Comments) "caused ulcerative colitis;bleeding ulcers"     Patient Measurements: Height: 5\' 11"  (180.3 cm) Weight: 90.7 kg (200 lb) IBW/kg (Calculated) : 75.3 Heparin Dosing Weight: 90.7  Vital Signs: Temp: 97.6 F (36.4 C) (02/03 1701) Temp Source: Oral (02/03 1701) BP: 128/96 (02/03 1830) Pulse Rate: 95 (02/03 1830)  Labs: Recent Labs    08/13/20 1549  HGB 16.5  HCT 49.9  PLT 141*  CREATININE 1.74*  TROPONINIHS 59*    Estimated Creatinine Clearance: 46.2 mL/min (A) (by C-G formula based on SCr of 1.74 mg/dL (H)).   Medical History: Past Medical History:  Diagnosis Date   Diabetes mellitus without complication (HCC)    High cholesterol    Hypertension     Medications:  No AC PTA  Assessment: 70 yo male admitted for near syncope.  EMS was called and patient found to have atrial flutter with heart rate up to 140.  Patient does not have history of atrial flutter or atrial fibrillation.  No anticoagulation PTA.  Patient did state he felt his heart beating a little differently a few months ago- likely not acute atrial fibrillation.  Cardiology following.  Per patient, he is 5'11" and weighs 200lb.   Goal of Therapy:  Heparin level 0.3-0.7 units/ml Monitor platelets by anticoagulation protocol: Yes   Plan:  Heparin 4500 unit bolus x1, then Initiate heparin drip at 1300 units/hr F/u 8 hour HL Monitor s/sx bleeding, daily CBC F/u cardiology plans if oral [facility]  Dimple Nanas, PharmD PGY-1 Acute Care Pharmacy Resident Office: [phone] 08/13/2020 6:55 PM

## 2020-08-13 NOTE — ED Provider Notes (Signed)
[facility] EMERGENCY DEPARTMENT Provider Note   CSN: [id number] Arrival date & time: 08/13/20  1534     History Chief Complaint  Patient presents with  . Near Syncope    Bobby Fields is a 70 y.o. male.  HPI Patient has history of diabetes and hypertension.  Was walking [place] gra[place] which is typical for him.  States he then began to suddenly feel lightheaded.  Felt as if he could pass out.  Went to the ground.  May have had a brief period of passing out but not sure.  Did not have chest pain but was reportedly pale and weak.  EMS was called and found to have atrial flutter with a heart rate of up to 140.  No history of A. fib/flutter.  Feeling better now.  States has had URI symptoms for the last few weeks.  Did have Covid a month ago.  States he did previously back in a few months ago feels heartbeat a little differently at that time to.  States he did take some cough medicine today.    Past Medical History:  Diagnosis Date  . Diabetes mellitus without complication ([place])   . High cholesterol   . Hypertension     There are no problems to display for this patient.   History reviewed. No pertinent surgical history.     No family history on file.     Home Medications Prior to Admission medications   Not on File    Allergies    Indomethacin  Review of Systems   Review of Systems  Constitutional: Positive for fatigue. Negative for appetite change and unexpected weight change.  HENT: Negative for congestion.   Respiratory: Negative for shortness of breath.   Cardiovascular: Positive for palpitations. Negative for chest pain.  Gastrointestinal: Negative for abdominal pain.  Genitourinary: Negative for flank pain.  Musculoskeletal: Negative for back pain.  Skin: Negative for rash.  Neurological: Positive for light-headedness.  Psychiatric/Behavioral: Negative for confusion.    Physical Exam Updated Vital Signs BP (!) 131/108   Pulse 96    Temp 97.6 F (36.4 C) (Oral)   Resp (!) 24   Ht 5\' 11"  (1.803 m)   Wt 90.7 kg   SpO2 98%   BMI 27.89 kg/m   Physical Exam Vitals and nursing note reviewed.  HENT:     Head: Normocephalic.  Eyes:     Pupils: Pupils are equal, round, and reactive to light.  Cardiovascular:     Rate and Rhythm: Normal rate and regular rhythm.  Pulmonary:     Breath sounds: No wheezing, rhonchi or rales.  Abdominal:     Tenderness: There is no abdominal tenderness.  Musculoskeletal:        General: No tenderness.     Cervical back: Neck supple.  Skin:    General: Skin is warm.     Capillary Refill: Capillary refill takes less than 2 seconds.  Neurological:     Mental Status: He is alert and oriented to person, place, and time.     ED Results / Procedures / Treatments   Labs (all labs ordered are listed, but only abnormal results are displayed) Labs Reviewed  BASIC METABOLIC PANEL - Abnormal; Notable for the following components:      Result Value   Glucose, Bld 159 (*)    BUN 26 (*)    Creatinine, Ser 1.74 (*)    GFR, Estimated 42 (*)    All other components within  normal limits  CBC - Abnormal; Notable for the following components:   WBC 13.6 (*)    Platelets 141 (*)    All other components within normal limits  TROPONIN I (HIGH SENSITIVITY) - Abnormal; Notable for the following components:   Troponin I (High Sensitivity) 59 (*)    All other components within normal limits  TSH  HEPARIN LEVEL (UNFRACTIONATED)  CBC  TROPONIN I (HIGH SENSITIVITY)    EKG EKG Interpretation  Date/Time:  Thursday August 13 2020 15:36:42 EST Ventricular Rate:  125 PR Interval:    QRS Duration: 80 QT Interval:  430 QTC Calculation: 620 R Axis:   86 Text Interpretation:  Critical Test Result: Long QTc Atrial flutter with variable A-V block Nonspecific ST abnormality Abnormal ECG Confirmed by Davonna Belling [phone]) on 08/13/2020 5:07:57 PM   Radiology DG Chest 2 View  Result Date:  08/13/2020 CLINICAL DATA:  Chest pain.  Presyncope. EXAM: CHEST - 2 VIEW COMPARISON:  None. FINDINGS: The cardiomediastinal contours are normal. Subsegmental bibasilar atelectasis. Pulmonary vasculature is normal. No consolidation, pleural effusion, or pneumothorax. No acute osseous abnormalities are seen. IMPRESSION: Subsegmental bibasilar atelectasis. Electronically Signed   By: Keith Rake M.D.   On: 08/13/2020 16:26    Procedures Procedures   Medications Ordered in ED Medications  heparin bolus via infusion 4,500 Units (has no administration in time range)  heparin ADULT infusion 100 units/mL (25000 units/259mL) (has no administration in time range)    ED Course  I have reviewed the triage vital signs and the nursing notes.  Pertinent labs & imaging results that were available during my care of the patient were reviewed by me and considered in my medical decision making (see chart for details).    MDM Rules/Calculators/A&P                          Patient presents with near syncopal episode.  Found to be in atrial flutter which patient was not previously diagnosed with.  Unsure of time of onset however.  Began to feel bad acutely today but now that his heart rate is controlled does not feel any different than his baseline.  Also had an episode few months ago where he felt his heart beating little strangely.  I do not think we can say this is an acute onset of the A. fib in under 48 hours where he will be a good candidate for ER cardioversion.  Also particularly with the near syncope and mildly elevated troponin feel the patient benefit for admission to the hospital to the cardiology service.  Will discuss with cardiologist  Cardiology is seen patient and recommends medicine admission due to diabetes and potentially acute kidney injury.  Will discuss with unassigned medicine since patient's PCP is in [facility]  Final Clinical Impression(s) / ED Diagnoses Final diagnoses:   Atrial flutter with rapid ventricular response ([place])  Near syncope    Rx / DC Orders ED Discharge Orders    None       Davonna Belling, MD 08/13/20 1858

## 2020-08-13 NOTE — H&P (Addendum)
Date: 08/13/2020               Patient Name:  Bobby Fields MRN: [id number]  DOB: 1951/06/06 Age / Sex: 70 y.o., male   PCP: Nicoletta Dress, MD         Medical Service: Internal Medicine Teaching Service         Attending Physician: Dr. Dareen Piano    First Contact: Dr. Alfonse Spruce Pager: [phone]  Second Contact: Dr. Marva Panda Pager: [phone]       After Hours (After 5p/  First Contact Pager: [phone]  weekends / holidays): Second Contact Pager: [phone]   Chief Complaint: Near-syncope  History of Present Illness:   Bobby Fields is a 70 y.o. gentleman w/ PMHx Type II DM, HTN, HLD, ulcerative colitis s/p total colectomy in 2013 with J pouch reversal at [facility] with hernia, and kidney stones requiring stent placement, and gout who presented to the ED after experiencing near-syncope earlier this evening. He states he was going for a walk with a few friends and had just walked for about 20 minutes. He felt SOB towards the end of his walk and was trying to get to his truck, when he suddenly developed dizziness, generalized weakness, and feelings of pre-syncope. He collapsed and was noted to be pale and diaphoretic by his friends. He does not believe he truly lost consciousness as he was able to hear his friends speaking to him the entire time. Denied feeling any CP or palpitations during this time. Initially thought maybe his blood sugar was low, although checked his sugar which was 98 and increased to 124 by the time EMS arrived. Notes he has not been eating or drinking much due to decreased appetite s/p mild COVID-19 infection 1 month ago. Has not had anything to eat or drink since 10:00am and did not eat or drink much yesterday either. Endorses persistent cough and congestion since COVID, headaches in the back of his neck over the past couple of weeks that improve with extra strength Tylenol, and current pain between his shoulder blades that he states feels muscular. Also endorses chronically cold hands and  feet worse at night, although denies any personal history of arrhythmia, cardiac disease, or thyroid disease and states his DM has been well-controlled recently. Saw Dr. Bettina Gavia in the past due to significant family hx CAD, although stress test was negative. Denies any urinary symptoms or dark or bloody stools recently.   ED Course: Patient brought to the ED by EMS, noted to be in A-Flutter with rates in the 140's that improved to 105, rate controlled without intervention since arrival to the ED. Initial troponins 59 > 156 > 260. Labs otherwise significant for Creatinine 1.74, BUN 26, GFR 42, WBC 13.6, Plt 141, glucose 159. Cardiology consulted, and patient was started on heparin infusion.   Home Medications: Omeprazole 20mg  daily Pravastatin 20mg  once weekly Zetia 10mg  daily Tresiba 20 units nightly Januvia 100mg  daily Glimepiride 4mg  daily Calcium Carb-Cholecalciferol 600-800mg  2 tabs daily  Patient had been on 81mg  ASA daily which was discontinued in November.  Recently started OTC medications for allergic conjunctivitis, although unsure which.  Allergies: Allergies as of 08/13/2020 - Review Complete 08/13/2020  Allergen Reaction Noted   Indomethacin Diarrhea 11/17/2011   Past Medical History:  Diagnosis Date   Diabetes mellitus without complication (HCC)    High cholesterol    Hypertension     Family History:  Brother had an MI at age 67, still living  Father had  MI at age 76 Mother died at age 59 due to CHF, also had dementia Grandparents all had history of heart disease  Social History:  Patient lives at home with his wife.  He has never smoked.  Drinks about 2-3 alcoholic beverages per week. Denies any history of illicit drug use.  Review of Systems: A complete ROS was negative except as per HPI.   Physical Exam: Blood pressure (!) 131/108, pulse 96, temperature 97.6 F (36.4 C), temperature source Oral, resp. rate (!) 24, height 5\' 11"  (1.803 m), weight 90.7  kg, SpO2 98 %.  General: Patient appears well. No acute distress. Eyes: Sclera non-icteric. No conjunctival injection.  HENT: Neck is supple. MMM. No nasal discharge. Respiratory: Lungs are CTA, bilaterally. No wheezes, rales, or rhonchi. No tachypnea or increased work of breathing on room air. Cardiovascular: Rate is normal. Rhythm is regular. No murmurs, rubs, or gallops. No lower extremity edema. Bilateral lower extremities are cool to the touch. Distal pulses are 2+ in all four extremities.  Neurological: Patient is alert and oriented x 3. No tremors. Sensation intact to light touch throughout.  Musculoskeletal: Strength is 5/5 throughout. Normal muscle bulk and tone.  Abdominal: Soft, non-distended, without tenderness to palpation, rebound, or guarding. Bowel sounds intact.  GU: No CVA tenderness.  Skin: Skin overlying bilateral shins is dry with flaking, although no open lesions or rashes noted.   Psych: Normal affect. Normal tone of voice.   EKG: personally reviewed my interpretation is Atrial Flutter at a rate of ~125bpm with variable A-V block. Delta waves intermittently present.   CXR: personally reviewed my interpretation is subsegmental bibasilar atelectasis. No acute cardiopulmonary process.   Assessment & Plan by Problem: Active Problems:   * No active hospital problems. *  # Near-Syncope likely 2/2 New-Onset Atrial Flutter  # NSTEMI Patient presents with chief complaint of near-syncope, found to have new-onset A-flutter w/ RVR by EMS. Rates normalized without intervention in the ED and cardiology were consulted, who started heparin pending need for procedure this hospitalization. Troponins continue to trend up; 56 > 156 > 260, likely due to demand ischemia due to arrhythmia. 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   - Cardiology following, appreciate  their recommendations - Continue heparin infusion - Add metoprolol 12.5mg  PO BID if HR increases above 110bpm  - Check TTE; based on results, consider DCCV - Check TSH - Continue to trend troponins - Continuous telemetry  - Continuous pulse oximetry - Ambulatory referral to AFIB clinic   # AKI vs. CKD Creatinine 1.74, BUN 26, GFR 42, potassium 3.9. Likely represents pre-renal AKI as patient states he has not had much to eat or drink over the last couple days with decreased appetite since COVID-19 infection 1 month ago. Has a history of kidney stones requiring ureteral stenting, although denies any pain or troubles urinating. May have component of underlying CKD in the setting of prolonged history of DM. Unable to be determined based on outside labs.   - Start LR @ 184mL/hr  - Continue to monitor daily BMP's   # Type II DM Patient initially thought his near-syncope was due to hypoglycemia, although was not found to be hypoglycemic by EMS. Glucose here 159. Denies hx of frequent hypoglycemia. States his DM is well-controlled at home on Tresiba 20 units nightly, Glimeperide and Januvia (although unsure of dosing of these).   - Check Hgb  A1c - Start Lantus 15 units nightly  - SSI - CBG monitoring   # Leukocytosis Initial WBC 13.6. May be reactive in the setting of acute illness. COVID-19 positive although likely due to prior infection that did not require hospitalization or supplemental O2 1 month ago. Does endorse persistent mild SOB, cough, congestion, fatigue, although CXR negative for acute pulmonary process. No other clear source of infection.   - Continue to monitor daily CBC's   # HTN Blood pressure have been slightly elevated in the ED. Takes lisinopril 5mg  nightly at home.   - Will hold lisinopril in the setting of possible AKI - Continue to monitor   # HLD   - Continue home Pravastatin 20mg  - Continue home Zetia 10mg  daily  Code Status: Full Code  Diet: Carb modified   DVT PPx: On heparin infusion IVF: LR @ 117mL/hr    Dispo: Admit patient to Observation with expected length of stay less than 2 midnights.  Signed: Jeralyn Bennett, MD 08/13/2020, 7:09 PM  Pager: [phone] After 5pm on weekdays and 1pm on weekends: On Call pager: [phone]

## 2020-08-13 NOTE — Consult Note (Signed)
Cardiology Admission History and Physical:   Patient ID: Bobby Fields MRN: [id number]; DOB: 1950/09/21   Admission date: 08/13/2020  Primary Care Provider: Nicoletta Dress, MD [facility] Cardiologist: Rudean Haskell MD (NEW- formerly [place]) [facility] Electrophysiologist:  None   Chief Complaint:  Near syncopy  Patient Profile:   Bobby Fields is a 70 y.o. male with DM with HTN, HLD, Ulcerative Colitis s/p resection and reversal at [facility] with hernia complication; prior kidney stones, who presents with near syncope  History of Present Illness:   Bobby Fields Notes that he was doing well until getting COVID-19 one month ago.  Distantly, saw Dr. Bettina Gavia for family history of CAD (brother, mother, and father) with a negative stress test.  Prior to getting COVID-19 was able to do yard work and walk without issues.    One month ago, he and his wife were diagnosed with COVID-19.  Patient had been recovering from his SOB and DOE to the point he was able to lay down fertilizer last week.  Two days ago was able to get his steps in (10,000 a day).  Patient notes that he is feeling weak this morning.  Has had no chest pain, chest pressure, chest tightness, chest stinging.  Went for a walk in [employer].  Felt near syncope; states that if he didn't lay down, he was going to pass out.  Laid himself on the grounds, call to his friends who noted he looked ashen, and they called 911.   No shortness of breath at that that time, but notes persistent DOE.  No PND or orthopnea.  No bendopnea, weight gain, leg swelling , or abdominal swelling.  No syncope or near syncope prior to this event but has felt weak in general . Notes  no palpitations or funny heart beats; only notes a persistent cough.  Patient seen by EMS.  Found to have A flutter rate 140->, 105 bpm.  On strips reviewed.  Presented to ED with rate controlled flutter without intervention.  First novel troponin greater than 18.  Cardiology  consulted for near syncope and new AFl.   Patient reports prior cardiac testing including distant stress test  But no prior cardioversion.  Past Medical History:  Diagnosis Date  . Diabetes mellitus without complication ([place])   . High cholesterol   . Hypertension     History reviewed. No pertinent surgical history.   Medications Prior to Admission: Prior to Admission medications   Not on File     Allergies:    Allergies  Allergen Reactions  . Indomethacin Diarrhea    Other reaction(s): Other (See Comments) "caused ulcerative colitis;bleeding ulcers"     Social History:   Social History   Socioeconomic History  . Marital status: Married    Spouse name: Not on file  . Number of children: Not on file  . Years of education: Not on file  . Highest education level: Not on file  Occupational History  . Not on file  Tobacco Use  . Smoking status: Not on file  . Smokeless tobacco: Not on file  Substance and Sexual Activity  . Alcohol use: Not on file  . Drug use: Not on file  . Sexual activity: Not on file  Other Topics Concern  . Not on file  Social History Narrative  . Not on file   Social Determinants of Health   Financial Resource Strain: Not on file  Food Insecurity: Not on file  Transportation Needs: Not on file  Physical Activity: Not on file  Stress: Not on file  Social Connections: Not on file  Intimate Partner Violence: Not on file    Family History:   History of coronary artery disease notable for father (39), brother (age 44), mother (age 48). History of heart failure notable for no members. History of arrhythmia notable for no members.  ROS:  Please see the history of present illness.  All other ROS reviewed and negative.     Physical Exam/Data:   Vitals:   08/13/20 1800 08/13/20 1830 08/13/20 1845 08/13/20 1915  BP: (!) 128/97 (!) 128/96 (!) 131/108 136/88  Pulse: (!) 102 95 96 95  Resp: (!) 23 (!) 23 (!) 24 (!) 25  Temp:    97.9 F  (36.6 C)  TempSrc:    Temporal  SpO2: 96% 96% 98% 98%  Weight:  90.7 kg    Height:  5\' 11"  (1.803 m)     No intake or output data in the 24 hours ending 08/13/20 2001 Last 3 Weights 08/13/2020  Weight (lbs) 200 lb  Weight (kg) 90.719 kg     Body mass index is 27.89 kg/m.  General:  Well nourished, well developed, in no acute distress HEENT: normal Lymph: no adenopathy Neck: no JVD Endocrine:  No thryomegaly Vascular: No carotid bruits; FA pulses 2+ bilaterally without bruits  Cardiac:  normal S1, S2; RRR; no murmur  Lungs:  clear to auscultation bilaterally, no wheezing, rhonchi or rales  Abd: soft, nontender, no hepatomegaly  Ext: no edema Musculoskeletal:  No deformities, BUE and BLE strength normal and equal Skin: warm and dry  Neuro:  CNs 2-12 intact, no focal abnormalities noted Psych:  Normal affect    EKG:  The ECG that was done  was personally reviewed and demonstrates AFl rate 125 bpm  Telemetry:  Afl rates 110-90   Relevant CV Studies: none  Laboratory Data:  High Sensitivity Troponin:   Recent Labs  Lab 08/13/20 1549 08/13/20 1737  TROPONINIHS 59* 156*      Chemistry Recent Labs  Lab 08/13/20 1549  NA 138  K 3.9  CL 101  CO2 24  GLUCOSE 159*  BUN 26*  CREATININE 1.74*  CALCIUM 9.5  GFRNONAA 42*  ANIONGAP 13    No results for input(s): PROT, ALBUMIN, AST, ALT, ALKPHOS, BILITOT in the last 168 hours. Hematology Recent Labs  Lab 08/13/20 1549  WBC 13.6*  RBC 5.71  HGB 16.5  HCT 49.9  MCV 87.4  MCH 28.9  MCHC 33.1  RDW 13.4  PLT 141*   BNPNo results for input(s): BNP, PROBNP in the last 168 hours.  DDimer No results for input(s): DDIMER in the last 168 hours.  Radiology/Studies:  DG Chest 2 View  Result Date: 08/13/2020 CLINICAL DATA:  Chest pain.  Presyncope. EXAM: CHEST - 2 VIEW COMPARISON:  None. FINDINGS: The cardiomediastinal contours are normal. Subsegmental bibasilar atelectasis. Pulmonary vasculature is normal. No  consolidation, pleural effusion, or pneumothorax. No acute osseous abnormalities are seen. IMPRESSION: Subsegmental bibasilar atelectasis. Electronically Signed   By: Keith Rake M.D.   On: 08/13/2020 16:26    Assessment and Plan:   Near syncope in the setting of new Atrial Flutter AKI  - Risk factors include age, DM with HTN - CHADSVASC=3. - TSH pending for add on - Will get obtain TTE (ordered) - We have started anticoagulation with heparin pending need for procedures within this hospitalization. - trending troponin to evaluation for NSTEMI  - Continue rate control  without intervention; low threshold to had low dose BB (metoprolol 12.5 mg PO BID) if heart rate increases above 110 bpm - Considerations for DCCV : presently feels generalized fatigue; may be appropriate for TEE/DCCV, but as he is only mildly symptomatic, would recommend TTE to evaluation for indications for rhythm control strategy  Diabetes with hypertension - on no antihypertensives at home; can start low dose BB as above as needed   Discussed with patient, nursing, patient's wife, pharmacy and primary MD. We will continue to follow  Risk Assessment/Risk Scores:       CHA2DS2-VASc Score = 3  This indicates a 3.2% annual risk of stroke. The patient's score is based upon: CHF History: No HTN History: Yes Diabetes History: Yes Stroke History: No Vascular Disease History: No Age Score: 1 Gender Score: 0      For questions or updates, please contact [place] Please consult [email or web address] for contact info under     Signed, Werner Lean, MD  08/13/2020 8:01 PM

## 2020-08-14 ENCOUNTER — Observation Stay (HOSPITAL_COMMUNITY): Payer: Medicare HMO

## 2020-08-14 ENCOUNTER — Encounter (HOSPITAL_COMMUNITY): Payer: Self-pay | Admitting: Internal Medicine

## 2020-08-14 ENCOUNTER — Inpatient Hospital Stay (HOSPITAL_COMMUNITY): Payer: Medicare HMO

## 2020-08-14 DIAGNOSIS — Z9049 Acquired absence of other specified parts of digestive tract: Secondary | ICD-10-CM | POA: Diagnosis not present

## 2020-08-14 DIAGNOSIS — E7800 Pure hypercholesterolemia, unspecified: Secondary | ICD-10-CM | POA: Diagnosis not present

## 2020-08-14 DIAGNOSIS — Z8719 Personal history of other diseases of the digestive system: Secondary | ICD-10-CM | POA: Diagnosis not present

## 2020-08-14 DIAGNOSIS — Z20822 Contact with and (suspected) exposure to covid-19: Secondary | ICD-10-CM | POA: Diagnosis not present

## 2020-08-14 DIAGNOSIS — I4892 Unspecified atrial flutter: Secondary | ICD-10-CM

## 2020-08-14 DIAGNOSIS — I4891 Unspecified atrial fibrillation: Secondary | ICD-10-CM

## 2020-08-14 DIAGNOSIS — Z7984 Long term (current) use of oral hypoglycemic drugs: Secondary | ICD-10-CM | POA: Diagnosis not present

## 2020-08-14 DIAGNOSIS — I483 Typical atrial flutter: Secondary | ICD-10-CM | POA: Diagnosis not present

## 2020-08-14 DIAGNOSIS — Z8249 Family history of ischemic heart disease and other diseases of the circulatory system: Secondary | ICD-10-CM | POA: Diagnosis not present

## 2020-08-14 DIAGNOSIS — I130 Hypertensive heart and chronic kidney disease with heart failure and stage 1 through stage 4 chronic kidney disease, or unspecified chronic kidney disease: Secondary | ICD-10-CM | POA: Diagnosis not present

## 2020-08-14 DIAGNOSIS — I2602 Saddle embolus of pulmonary artery with acute cor pulmonale: Secondary | ICD-10-CM | POA: Diagnosis not present

## 2020-08-14 DIAGNOSIS — I2721 Secondary pulmonary arterial hypertension: Secondary | ICD-10-CM | POA: Diagnosis present

## 2020-08-14 DIAGNOSIS — Z888 Allergy status to other drugs, medicaments and biological substances status: Secondary | ICD-10-CM | POA: Diagnosis not present

## 2020-08-14 DIAGNOSIS — I2699 Other pulmonary embolism without acute cor pulmonale: Secondary | ICD-10-CM | POA: Diagnosis not present

## 2020-08-14 DIAGNOSIS — Z87442 Personal history of urinary calculi: Secondary | ICD-10-CM | POA: Diagnosis not present

## 2020-08-14 DIAGNOSIS — Z79899 Other long term (current) drug therapy: Secondary | ICD-10-CM | POA: Diagnosis not present

## 2020-08-14 DIAGNOSIS — Z9889 Other specified postprocedural states: Secondary | ICD-10-CM | POA: Diagnosis not present

## 2020-08-14 DIAGNOSIS — R911 Solitary pulmonary nodule: Secondary | ICD-10-CM | POA: Diagnosis not present

## 2020-08-14 DIAGNOSIS — M109 Gout, unspecified: Secondary | ICD-10-CM | POA: Diagnosis present

## 2020-08-14 DIAGNOSIS — U099 Post covid-19 condition, unspecified: Secondary | ICD-10-CM | POA: Diagnosis not present

## 2020-08-14 DIAGNOSIS — E785 Hyperlipidemia, unspecified: Secondary | ICD-10-CM | POA: Diagnosis present

## 2020-08-14 DIAGNOSIS — I4430 Unspecified atrioventricular block: Secondary | ICD-10-CM | POA: Diagnosis present

## 2020-08-14 DIAGNOSIS — N183 Chronic kidney disease, stage 3 unspecified: Secondary | ICD-10-CM | POA: Diagnosis present

## 2020-08-14 DIAGNOSIS — I2692 Saddle embolus of pulmonary artery without acute cor pulmonale: Secondary | ICD-10-CM | POA: Diagnosis not present

## 2020-08-14 DIAGNOSIS — D72829 Elevated white blood cell count, unspecified: Secondary | ICD-10-CM | POA: Diagnosis not present

## 2020-08-14 DIAGNOSIS — N179 Acute kidney failure, unspecified: Secondary | ICD-10-CM | POA: Diagnosis not present

## 2020-08-14 DIAGNOSIS — E119 Type 2 diabetes mellitus without complications: Secondary | ICD-10-CM | POA: Diagnosis not present

## 2020-08-14 DIAGNOSIS — E1122 Type 2 diabetes mellitus with diabetic chronic kidney disease: Secondary | ICD-10-CM | POA: Diagnosis not present

## 2020-08-14 LAB — URINALYSIS, ROUTINE W REFLEX MICROSCOPIC
Bilirubin Urine: NEGATIVE
Glucose, UA: NEGATIVE mg/dL
Hgb urine dipstick: NEGATIVE
Ketones, ur: 5 mg/dL — AB
Leukocytes,Ua: NEGATIVE
Nitrite: NEGATIVE
Protein, ur: NEGATIVE mg/dL
Specific Gravity, Urine: 1.027 (ref 1.005–1.030)
pH: 5 (ref 5.0–8.0)

## 2020-08-14 LAB — CREATININE, URINE, RANDOM: Creatinine, Urine: 275.78 mg/dL

## 2020-08-14 LAB — BASIC METABOLIC PANEL
Anion gap: 12 (ref 5–15)
BUN: 30 mg/dL — ABNORMAL HIGH (ref 8–23)
CO2: 20 mmol/L — ABNORMAL LOW (ref 22–32)
Calcium: 8.9 mg/dL (ref 8.9–10.3)
Chloride: 106 mmol/L (ref 98–111)
Creatinine, Ser: 1.57 mg/dL — ABNORMAL HIGH (ref 0.61–1.24)
GFR, Estimated: 47 mL/min — ABNORMAL LOW (ref >60)
Glucose, Bld: 226 mg/dL — ABNORMAL HIGH (ref 70–99)
Potassium: 4.2 mmol/L (ref 3.5–5.1)
Sodium: 138 mmol/L (ref 135–145)

## 2020-08-14 LAB — ECHOCARDIOGRAM LIMITED
AR max vel: 1.72 cm2
AV Area VTI: 1.58 cm2
AV Area mean vel: 1.62 cm2
AV Mean grad: 7 mmHg
AV Peak grad: 12.8 mmHg
Ao pk vel: 1.79 m/s
Area-P 1/2: 3.39 cm2
Calc EF: 65.3 %
Height: 71 in
S' Lateral: 3.5 cm
Single Plane A2C EF: 57.9 %
Single Plane A4C EF: 69.1 %
Weight: 3200 oz

## 2020-08-14 LAB — CBC
HCT: 47.9 % (ref 39.0–52.0)
Hemoglobin: 14.8 g/dL (ref 13.0–17.0)
MCH: 27.7 pg (ref 26.0–34.0)
MCHC: 30.9 g/dL (ref 30.0–36.0)
MCV: 89.5 fL (ref 80.0–100.0)
Platelets: 134 10*3/uL — ABNORMAL LOW (ref 150–400)
RBC: 5.35 MIL/uL (ref 4.22–5.81)
RDW: 13.6 % (ref 11.5–15.5)
WBC: 8.5 10*3/uL (ref 4.0–10.5)
nRBC: 0 % (ref 0.0–0.2)

## 2020-08-14 LAB — HEPARIN LEVEL (UNFRACTIONATED)
Heparin Unfractionated: 0.33 IU/mL (ref 0.30–0.70)
Heparin Unfractionated: 0.31 IU/mL (ref 0.30–0.70)

## 2020-08-14 LAB — CBG MONITORING, ED
Glucose-Capillary: 117 mg/dL — ABNORMAL HIGH (ref 70–99)
Glucose-Capillary: 146 mg/dL — ABNORMAL HIGH (ref 70–99)

## 2020-08-14 LAB — HEMOGLOBIN A1C
Hgb A1c MFr Bld: 7.5 % — ABNORMAL HIGH (ref 4.8–5.6)
Mean Plasma Glucose: 168.55 mg/dL

## 2020-08-14 LAB — GLUCOSE, CAPILLARY
Glucose-Capillary: 72 mg/dL (ref 70–99)
Glucose-Capillary: 199 mg/dL — ABNORMAL HIGH (ref 70–99)

## 2020-08-14 LAB — TROPONIN I (HIGH SENSITIVITY)
Troponin I (High Sensitivity): 245 ng/L (ref <18)
Troponin I (High Sensitivity): 158 ng/L (ref <18)

## 2020-08-14 LAB — SODIUM, URINE, RANDOM: Sodium, Ur: 25 mmol/L

## 2020-08-14 LAB — HIV ANTIBODY (ROUTINE TESTING W REFLEX): HIV Screen 4th Generation wRfx: NONREACTIVE

## 2020-08-14 LAB — D-DIMER, QUANTITATIVE: D-Dimer, Quant: 14.27 ug/mL-FEU — ABNORMAL HIGH (ref 0.00–0.50)

## 2020-08-14 LAB — TSH: TSH: 0.505 u[IU]/mL (ref 0.350–4.500)

## 2020-08-14 MED ORDER — PERFLUTREN LIPID MICROSPHERE
1.0000 mL | INTRAVENOUS | Status: AC | PRN
  Administered 2020-08-14: 2 mL via INTRAVENOUS
  Filled 2020-08-14: qty 10

## 2020-08-14 MED ORDER — IOHEXOL 350 MG/ML SOLN
100.0000 mL | Freq: Once | INTRAVENOUS | Status: AC | PRN
  Administered 2020-08-14: 100 mL via INTRAVENOUS

## 2020-08-14 MED ORDER — LACTATED RINGERS IV SOLN: INTRAVENOUS | Status: AC

## 2020-08-14 NOTE — ED Notes (Signed)
No chest pain at present  The pt is hungry  He wants the results of his tests so he can eat  Intermittent chest pain lasting only seconds

## 2020-08-14 NOTE — Progress Notes (Signed)
[place] for heparin Indication: atrial fibrillation  Allergies  Allergen Reactions  . Indomethacin Diarrhea    Other reaction(s): Other (See Comments) "caused ulcerative colitis;bleeding ulcers"     Patient Measurements: Height: 5\' 11"  (180.3 cm) Weight: 90.7 kg (200 lb) IBW/kg (Calculated) : 75.3 Heparin Dosing Weight: 90.7  Vital Signs: Temp: 97.8 F (36.6 C) (02/04 0600) Temp Source: Oral (02/04 0600) BP: 122/80 (02/04 0930) Pulse Rate: 75 (02/04 0930)  Labs: Recent Labs    08/13/20 1549 08/13/20 1737 08/13/20 2130 08/14/20 0208 08/14/20 0609 08/14/20 1030  HGB 16.5  --   --  14.8  --   --   HCT 49.9  --   --  47.9  --   --   PLT 141*  --   --  134*  --   --   HEPARINUNFRC  --   --   --  0.33  --  0.31  CREATININE 1.74*  --   --  1.57*  --   --   TROPONINIHS 59* 156* 260*  --  245*  --     Estimated Creatinine Clearance: 51.2 mL/min (A) (by C-G formula based on SCr of 1.57 mg/dL (H)).  Assessment: 70 yo male admitted for near syncope.  EMS was called and patient found to have atrial flutter with heart rate up to 140.  Patient does not have history of atrial flutter or atrial fibrillation.  No anticoagulation PTA.  Patient did state he felt his heart beating a little differently a few months ago- likely not acute atrial fibrillation.  Cardiology following.  Confirmatory heparin level 0.31 is therapeutic on 1300 units/hr though at lower end of target range of 0.3 to 0.7. Hgb wnl. Plt 134. No reported bleeding.  Goal of Therapy:  Heparin level 0.3-0.7 units/ml Monitor platelets by anticoagulation protocol: Yes   Plan:  Increase heparin to 1350 units/hr to ensure remains in therapeutic range  Monitor heparin level, CBC and S/S of bleeding daily  Next heparin level tomorrow morning due to therapeutic x2  Follow up cardiology plans - current plan to continue IV heparin with plan to transition to apixaban when all  procedures are completed Consult to CM for apixaban cost placed   Cristela Felt, PharmD Clinical Pharmacist  08/14/2020 11:42 AM

## 2020-08-14 NOTE — Progress Notes (Signed)
  Echocardiogram 2D Echocardiogram with definity has been performed.  Darlina Sicilian M 08/14/2020, 9:39 AM

## 2020-08-14 NOTE — ED Notes (Signed)
Monitor af regular rhythym

## 2020-08-14 NOTE — Progress Notes (Signed)
Subjective:   Hospital day: 1  Overnight events: no acute event  Patient is seen at bedside.  He appears comfortable with no acute distress.  He walks to the bathroom this morning without difficulty or chest pain.  He reports that he has past left splinter like chest pain worse with deep inhalation, last a few seconds, worse with exertion. No prior heart disease to his knowledge. Not a smoker. History of kidney stone. Not drinking much water recently, lots of output to J bag, but not more liquid. Recent COVID infection but no longer having fever some residual cough.    PCP: Dr. Judi Saa, [facility]  Objective:  Vital signs in last 24 hours: Vitals:   08/14/20 0100 08/14/20 0200 08/14/20 0540 08/14/20 0600  BP: 106/74 114/74 116/83   Pulse: 96 79 74 73  Resp: (!) 21 (!) 22 (!) 21 (!) 23  Temp:    97.8 F (36.6 C)  TempSrc:    Oral  SpO2: 94% 94% 94% 95%  Weight:      Height:       CBC Latest Ref Rng & Units 08/14/2020 08/13/2020  WBC 4.0 - 10.5 K/uL 8.5 13.6(H)  Hemoglobin 13.0 - 17.0 g/dL 14.8 16.5  Hematocrit 39.0 - 52.0 % 47.9 49.9  Platelets 150 - 400 K/uL 134(L) 141(L)   CMP Latest Ref Rng & Units 08/14/2020 08/13/2020  Glucose 70 - 99 mg/dL 226(H) 159(H)  BUN 8 - 23 mg/dL 30(H) 26(H)  Creatinine 0.61 - 1.24 mg/dL 1.57(H) 1.74(H)  Sodium 135 - 145 mmol/L 138 138  Potassium 3.5 - 5.1 mmol/L 4.2 3.9  Chloride 98 - 111 mmol/L 106 101  CO2 22 - 32 mmol/L 20(L) 24  Calcium 8.9 - 10.3 mg/dL 8.9 9.5    Physical Exam  Physical Exam Constitutional:      General: He is not in acute distress. HENT:     Head: Normocephalic.  Eyes:     General:        Right eye: No discharge.        Left eye: No discharge.  Cardiovascular:     Rate and Rhythm: Normal rate. Rhythm irregular.  Pulmonary:     Effort: Pulmonary effort is normal. No respiratory distress.     Breath sounds: Normal breath sounds.  Abdominal:     General: Bowel sounds are normal.   Musculoskeletal:     Cervical back: Normal range of motion.     Right lower leg: No edema.     Left lower leg: No edema.  Skin:    General: Skin is warm.  Neurological:     Mental Status: He is alert.  Psychiatric:        Mood and Affect: Mood normal.     Assessment/Plan: Bobby Fields is a 70 y.o. male with hx of type 2 diabetes, hyperlipidemia, ulcerative colitis status post colectomy 2013, Covid infection 1 month ago, who presented for a near syncopal episode found to have a flutter with RVR and elevated troponin.  Active Problems:   Atrial flutter (HCC)  Near-Syncope likely 2/2 New-Onset Atrial Flutter  Elevated troponin - NSTEMI vs demand ischemia Patient presents with chief complaint of near-syncope, found to have new-onset A-flutter w/ RVR. Rates normalized without intervention in the ED. Cardiology was consulted.  Patient is anticoagulate with heparin drip for CHA2DS2-VASc score of 3.  Plan to obtain TTE today to decide between rate or rhythm control.  Per cardiology, can add metoprolol if heart  rate increase above 110. Troponins continue trend: 56 > 156 > 260 > 256.  Likely demand ischemia vs NSTEMI.  Currently on heparin drip.  We will continue to trend troponin.  If troponin continues to trend up, patient may require angiogram.  - Cardiology following, appreciate their recommendations - Continue heparin infusion - Add metoprolol 12.5mg  PO BID if HR increases above 110bpm  - Pending TTE; based on results, consider DCCV - Pending TSH - Continue to trend troponins - Continuous telemetry  - Continuous pulse oximetry - Ambulatory referral to AFIB clinic    AKI vs. CKD Creatinine 1.74 > 1.57. Likely represents pre-renal AKI 2/2 decreased appetite since COVID-19 infection 1 month ago.  Patient denies any kidney disease history.  Will reach out to his PCP to obtain baseline creatinine.  Continue fluid hydration with LR and monitor BMP - Continue LR @ 149mL/hr  - Continue to  monitor daily BMP's  -Hold lisinopril   Type II DM A1c 7.5.  Home regimen Tresiba 20 units nightly, Glimeperide and Januvia. Fasting CBG this morning 117. - Lantus 15 units nightly  - SSI - CBG monitoring    HTN Normotensive at this time -Holding lisinopril in the setting of possible AKI - Continue to monitor    HLD  - Continue home Pravastatin 20mg  - Continue home Zetia 10mg  daily   Code Status: Full Code  Diet: Carb modified  DVT PPx: On heparin infusion IVF: LR @ 15mL/hr    Prior to Admission Living Arrangement: Home Anticipated Discharge Location: To be determined Barriers to Discharge: Cardiac work-up   Bobby Gerold, DO 08/14/2020, 7:18 AM Pager: [phone] After 5pm on weekdays and 1pm on weekends: On Call pager [phone]

## 2020-08-14 NOTE — Progress Notes (Addendum)
Progress Note  Patient Name: Bobby Fields Date of Encounter: 08/14/2020  [facility] Cardiologist: Werner Lean, MD   Subjective   Some chest tingling and pressure at times.  No SOB   Inpatient Medications    Scheduled Meds: . ezetimibe  10 mg Oral Daily  . insulin aspart  0-15 Units Subcutaneous TID WC  . insulin aspart  0-5 Units Subcutaneous QHS  . insulin glargine  15 Units Subcutaneous QHS  . pantoprazole  40 mg Oral Daily  . [START ON 08/16/2020] pravastatin  20 mg Oral Q Sun   Continuous Infusions: . heparin 1,300 Units/hr (08/13/20 1913)  . lactated ringers Stopped (08/14/20 0949)   PRN Meds: acetaminophen, ondansetron (ZOFRAN) IV, perflutren lipid microspheres (DEFINITY) IV suspension   Vital Signs    Vitals:   08/14/20 0200 08/14/20 0540 08/14/20 0600 08/14/20 0930  BP: 114/74 116/83  122/80  Pulse: 79 74 73 75  Resp: (!) 22 (!) 21 (!) 23 (!) 27  Temp:   97.8 F (36.6 C)   TempSrc:   Oral   SpO2: 94% 94% 95% 96%  Weight:      Height:       No intake or output data in the 24 hours ending 08/14/20 1000 Last 3 Weights 08/13/2020  Weight (lbs) 200 lb  Weight (kg) 90.719 kg      Telemetry    Atrial flutter- Personally Reviewed  ECG    Atrial flutter with rate control  - Personally Reviewed  Physical Exam   GEN: No acute distress.   Neck: No JVD Cardiac: irreg , no murmurs, rubs, or gallops.  Respiratory: Clear to auscultation bilaterally. GI: Soft, nontender, non-distended  MS: No edema; No deformity. Neuro:  Nonfocal  Psych: Normal affect   Labs    High Sensitivity Troponin:   Recent Labs  Lab 08/13/20 1549 08/13/20 1737 08/13/20 2130 08/14/20 0609  TROPONINIHS 59* 156* 260* 245*      Chemistry Recent Labs  Lab 08/13/20 1549 08/14/20 0208  NA 138 138  K 3.9 4.2  CL 101 106  CO2 24 20*  GLUCOSE 159* 226*  BUN 26* 30*  CREATININE 1.74* 1.57*  CALCIUM 9.5 8.9  GFRNONAA 42* 47*  ANIONGAP 13 12      Hematology Recent Labs  Lab 08/13/20 1549 08/14/20 0208  WBC 13.6* 8.5  RBC 5.71 5.35  HGB 16.5 14.8  HCT 49.9 47.9  MCV 87.4 89.5  MCH 28.9 27.7  MCHC 33.1 30.9  RDW 13.4 13.6  PLT 141* 134*    BNPNo results for input(s): BNP, PROBNP in the last 168 hours.   DDimer No results for input(s): DDIMER in the last 168 hours.   Radiology    DG Chest 2 View  Result Date: 08/13/2020 CLINICAL DATA:  Chest pain.  Presyncope. EXAM: CHEST - 2 VIEW COMPARISON:  None. FINDINGS: The cardiomediastinal contours are normal. Subsegmental bibasilar atelectasis. Pulmonary vasculature is normal. No consolidation, pleural effusion, or pneumothorax. No acute osseous abnormalities are seen. IMPRESSION: Subsegmental bibasilar atelectasis. Electronically Signed   By: Keith Rake M.D.   On: 08/13/2020 16:26    Cardiac Studies   Echo pending   Patient Profile     70 y.o. male with hx of DM, HTN, HLD, Ulcerative Colitis s/p resection and reversal at [facility] with hernia complication; prior kidney stones,who presented with near syncope and found to be in atrial flutter.   Was COVID positive one month ago.     Assessment &  Plan    Near syncope in setting of new atrial flutter, AKI -chad2DS2VASc of 3 -echo pending -On IV heparin -consideration for DCCV -troponin 59,156, 260,245  Has shoveled snow without chest pain or SOB, but some atypical chest discomfort since syncope -Cr 1.57 today down from 1.74 - Heart rate controlled  BP stable. -will make NPO did have BK  DM-2  -A1C of 7.5  Per IM  Recent COVID 1 month ago, per IM  No need for isolation   For questions or updates, please contact [facility] Please consult [email or web address] for contact info under  Signed, Cecilie Kicks, NP  08/14/2020, 10:00 AM   As above, patient seen and examined.  Patient admitted with near syncope.  He has had some minor burning chest pain/tingling at times but typically denies dyspnea on exertion, exertional chest  pain.  He has been found to be in atrial flutter.  Creatinine 1.57.  1 atrial flutter-patient presented with newly diagnosed atrial flutter. Await echocardiogram to assess LV function.  His heart rate is controlled on no medications.  Continue heparin for now. CHADSvasc 3.  Transition to apixaban 5 mg twice daily once all procedures complete.  Refer to electrophysiology as an outpatient for ablation.  2 Near syncope-etiology unclear.  He did have recent Covid and has had decreased p.o. intake.  Question contribution from dehydration.  His heart rate is also controlled on no medications in atrial flutter and bradycardia may have contributed.  Await echocardiogram for LV function.  May need a monitor following discharge.  3 elevated troponin-mild elevation in troponin of uncertain etiology.  May be related to atrial flutter and renal insufficiency.  Await echocardiogram.  If LV function abnormal would likely require catheterization.  If normal will arrange [place] nuclear study for risk stratification particularly in light of renal insufficiency and risk of contrast nephropathy.  4 chronic stage III kidney disease-patient appears to have chronic renal insufficiency (in reviewing laboratories from 2017 creatinine was 2.04 at that time).  Continue to follow while in hospital.  5 hypertension-ACE inhibitor has been held.  If blood pressure increases would resume.  Kirk Ruths, MD

## 2020-08-14 NOTE — Progress Notes (Signed)
[place] for heparin Indication: atrial fibrillation  Allergies  Allergen Reactions  . Indomethacin Diarrhea    Other reaction(s): Other (See Comments) "caused ulcerative colitis;bleeding ulcers"     Patient Measurements: Height: 5\' 11"  (180.3 cm) Weight: 90.7 kg (200 lb) IBW/kg (Calculated) : 75.3 Heparin Dosing Weight: 90.7  Vital Signs: Temp: 97.9 F (36.6 C) (02/03 1915) Temp Source: Temporal (02/03 1915) BP: 114/74 (02/04 0200) Pulse Rate: 79 (02/04 0200)  Labs: Recent Labs    08/13/20 1549 08/13/20 1737 08/13/20 2130 08/14/20 0208  HGB 16.5  --   --  14.8  HCT 49.9  --   --  47.9  PLT 141*  --   --  134*  HEPARINUNFRC  --   --   --  0.33  CREATININE 1.74*  --   --   --   TROPONINIHS 59* 156* 260*  --     Estimated Creatinine Clearance: 46.2 mL/min (A) (by C-G formula based on SCr of 1.74 mg/dL (H)).  Assessment: 70 yo male admitted for near syncope.  EMS was called and patient found to have atrial flutter with heart rate up to 140.  Patient does not have history of atrial flutter or atrial fibrillation.  No anticoagulation PTA.  Patient did state he felt his heart beating a little differently a few months ago- likely not acute atrial fibrillation.  Cardiology following.  Heparin level therapeutic (0.33) on gtt at 1300 units/hr.  Goal of Therapy:  Heparin level 0.3-0.7 units/ml Monitor platelets by anticoagulation protocol: Yes   Plan:  Continue heparin drip at 1300 units/hr F/u 6 hour confirmatory HL F/u cardiology plans if oral [facility]  Sherlon Handing, PharmD, BCPS Please see amion for complete clinical pharmacist phone list 08/14/2020 3:43 AM

## 2020-08-14 NOTE — ED Notes (Signed)
Report called to rn sara on 6e

## 2020-08-14 NOTE — ED Notes (Signed)
Tele, no isolation for covid Breakfast Ordered

## 2020-08-14 NOTE — Progress Notes (Addendum)
EF was normal, no RWMA - dicussed with Dr. Stanford Breed and will do lexiscan myoview tomorrow.  Pt will be NPO after MN -Nuc med dept aware.  Given RVE and RV dysfunction; will order Ddimer and if elevated, will need VQ scan Kirk Ruths

## 2020-08-15 ENCOUNTER — Inpatient Hospital Stay (HOSPITAL_COMMUNITY): Payer: Medicare HMO

## 2020-08-15 DIAGNOSIS — I2699 Other pulmonary embolism without acute cor pulmonale: Secondary | ICD-10-CM

## 2020-08-15 DIAGNOSIS — I2602 Saddle embolus of pulmonary artery with acute cor pulmonale: Secondary | ICD-10-CM

## 2020-08-15 HISTORY — PX: IR INFUSION THROMBOL ARTERIAL INITIAL (MS): IMG5376

## 2020-08-15 HISTORY — PX: IR ANGIOGRAM PULMONARY BILATERAL SELECTIVE: IMG664

## 2020-08-15 HISTORY — PX: IR ANGIOGRAM SELECTIVE EACH ADDITIONAL VESSEL: IMG667

## 2020-08-15 LAB — CBC
HCT: 43.3 % (ref 39.0–52.0)
HCT: 48.4 % (ref 39.0–52.0)
HCT: 47.3 % (ref 39.0–52.0)
Hemoglobin: 13.9 g/dL (ref 13.0–17.0)
Hemoglobin: 15.3 g/dL (ref 13.0–17.0)
Hemoglobin: 15.5 g/dL (ref 13.0–17.0)
MCH: 27.6 pg (ref 26.0–34.0)
MCH: 27.7 pg (ref 26.0–34.0)
MCH: 28.3 pg (ref 26.0–34.0)
MCHC: 32.1 g/dL (ref 30.0–36.0)
MCHC: 31.6 g/dL (ref 30.0–36.0)
MCHC: 32.8 g/dL (ref 30.0–36.0)
MCV: 86.1 fL (ref 80.0–100.0)
MCV: 87.7 fL (ref 80.0–100.0)
MCV: 86.3 fL (ref 80.0–100.0)
Platelets: 145 10*3/uL — ABNORMAL LOW (ref 150–400)
Platelets: 147 10*3/uL — ABNORMAL LOW (ref 150–400)
Platelets: 144 10*3/uL — ABNORMAL LOW (ref 150–400)
RBC: 5.03 MIL/uL (ref 4.22–5.81)
RBC: 5.52 MIL/uL (ref 4.22–5.81)
RBC: 5.48 MIL/uL (ref 4.22–5.81)
RDW: 13.6 % (ref 11.5–15.5)
RDW: 13.6 % (ref 11.5–15.5)
RDW: 13.5 % (ref 11.5–15.5)
WBC: 7.3 10*3/uL (ref 4.0–10.5)
WBC: 8.3 10*3/uL (ref 4.0–10.5)
WBC: 9.5 10*3/uL (ref 4.0–10.5)
nRBC: 0 % (ref 0.0–0.2)
nRBC: 0 % (ref 0.0–0.2)
nRBC: 0 % (ref 0.0–0.2)

## 2020-08-15 LAB — GLUCOSE, CAPILLARY
Glucose-Capillary: 103 mg/dL — ABNORMAL HIGH (ref 70–99)
Glucose-Capillary: 151 mg/dL — ABNORMAL HIGH (ref 70–99)
Glucose-Capillary: 108 mg/dL — ABNORMAL HIGH (ref 70–99)
Glucose-Capillary: 138 mg/dL — ABNORMAL HIGH (ref 70–99)

## 2020-08-15 LAB — BASIC METABOLIC PANEL
Anion gap: 9 (ref 5–15)
BUN: 21 mg/dL (ref 8–23)
CO2: 22 mmol/L (ref 22–32)
Calcium: 8.3 mg/dL — ABNORMAL LOW (ref 8.9–10.3)
Chloride: 105 mmol/L (ref 98–111)
Creatinine, Ser: 1.36 mg/dL — ABNORMAL HIGH (ref 0.61–1.24)
GFR, Estimated: 56 mL/min — ABNORMAL LOW (ref >60)
Glucose, Bld: 112 mg/dL — ABNORMAL HIGH (ref 70–99)
Potassium: 3.9 mmol/L (ref 3.5–5.1)
Sodium: 136 mmol/L (ref 135–145)

## 2020-08-15 LAB — FIBRINOGEN
Fibrinogen: 382 mg/dL (ref 210–475)
Fibrinogen: 333 mg/dL (ref 210–475)

## 2020-08-15 LAB — HEPARIN LEVEL (UNFRACTIONATED)
Heparin Unfractionated: <0.1 IU/mL — ABNORMAL LOW (ref 0.30–0.70)
Heparin Unfractionated: 0.25 IU/mL — ABNORMAL LOW (ref 0.30–0.70)
Heparin Unfractionated: 0.23 IU/mL — ABNORMAL LOW (ref 0.30–0.70)

## 2020-08-15 LAB — MAGNESIUM: Magnesium: 2 mg/dL (ref 1.7–2.4)

## 2020-08-15 MED ORDER — MIDAZOLAM HCL 2 MG/2ML IJ SOLN
INTRAMUSCULAR | Status: AC | PRN
  Administered 2020-08-15 (×2): 0.5 mg via INTRAVENOUS
  Administered 2020-08-15: 1 mg via INTRAVENOUS

## 2020-08-15 MED ORDER — SODIUM CHLORIDE 0.9 % IV SOLN
250.0000 mL | INTRAVENOUS | Status: DC | PRN
  Administered 2020-08-15: 250 mL via INTRAVENOUS

## 2020-08-15 MED ORDER — SODIUM CHLORIDE 0.9 % IV SOLN
12.0000 mg | Freq: Once | INTRAVENOUS | Status: AC
  Administered 2020-08-15: 12 mg via INTRAVENOUS
  Filled 2020-08-15 (×3): qty 12

## 2020-08-15 MED ORDER — FENTANYL CITRATE (PF) 100 MCG/2ML IJ SOLN
INTRAMUSCULAR | Status: AC | PRN
  Administered 2020-08-15: 25 ug via INTRAVENOUS
  Administered 2020-08-15: 50 ug via INTRAVENOUS
  Administered 2020-08-15: 25 ug via INTRAVENOUS

## 2020-08-15 MED ORDER — SODIUM CHLORIDE 0.9 % IV SOLN: INTRAVENOUS | Status: DC

## 2020-08-15 MED ORDER — FENTANYL CITRATE (PF) 100 MCG/2ML IJ SOLN
INTRAMUSCULAR | Status: AC
  Filled 2020-08-15: qty 4

## 2020-08-15 MED ORDER — SODIUM CHLORIDE 0.9 % IV SOLN
12.0000 mg | Freq: Once | INTRAVENOUS | Status: AC
  Administered 2020-08-15: 12 mg via INTRAVENOUS
  Filled 2020-08-15: qty 12

## 2020-08-15 MED ORDER — IOHEXOL 300 MG/ML  SOLN
50.0000 mL | Freq: Once | INTRAMUSCULAR | Status: AC | PRN
  Administered 2020-08-15: 10 mL via INTRAVENOUS

## 2020-08-15 MED ORDER — SODIUM CHLORIDE 0.9% FLUSH: 3.0000 mL | INTRAVENOUS | Status: DC | PRN

## 2020-08-15 MED ORDER — LIDOCAINE HCL 1 % IJ SOLN
INTRAMUSCULAR | Status: AC | PRN
  Administered 2020-08-15: 5 mL

## 2020-08-15 MED ORDER — SODIUM CHLORIDE 0.9% FLUSH
3.0000 mL | Freq: Two times a day (BID) | INTRAVENOUS | Status: DC
  Administered 2020-08-16 – 2020-08-18 (×4): 3 mL via INTRAVENOUS

## 2020-08-15 MED ORDER — LIDOCAINE HCL 1 % IJ SOLN
INTRAMUSCULAR | Status: AC
  Filled 2020-08-15: qty 20

## 2020-08-15 MED ORDER — MIDAZOLAM HCL 2 MG/2ML IJ SOLN
INTRAMUSCULAR | Status: AC
  Filled 2020-08-15: qty 4

## 2020-08-15 MED ORDER — SODIUM CHLORIDE 0.9% FLUSH
3.0000 mL | Freq: Two times a day (BID) | INTRAVENOUS | Status: DC
  Administered 2020-08-15 – 2020-08-17 (×3): 3 mL via INTRAVENOUS

## 2020-08-15 MED ORDER — CHLORHEXIDINE GLUCONATE CLOTH 2 % EX PADS
6.0000 | MEDICATED_PAD | Freq: Every day | CUTANEOUS | Status: DC
  Administered 2020-08-15 – 2020-08-17 (×3): 6 via TOPICAL

## 2020-08-15 NOTE — Progress Notes (Signed)
Report called to [place], RN for transfer to [facility] for cdTPA . Heparin gtt infusing at 13.5 ml/hr via Rt AC PIV. RA. Wife at bedside.

## 2020-08-15 NOTE — Consult Note (Signed)
NAME:  Bobby Fields, MRN:  [id number], DOB:  April 25, 1951, LOS: 1 ADMISSION DATE:  08/13/2020, CONSULTATION DATE:  08/15/20 REFERRING MD:  Quentin Ore, CHIEF COMPLAINT:  syncope   Brief History   70 year old man with hx of COVID 1 month ago presenting with syncope found to have PE with right heart strain.  History of present illness   70 year old man with hx of HTN, HLD, DM, COVID infection ~1 mo ago presenting after passing out.  Found in ER to be in [place] yesterday with initial thoughts that this was symptomatic atrial flutter. Admitted to PCU where subsequent workup revealed elevated D dimer and CTA chest showed saddle PE with R heart strain. Currently feels well, denies any further presyncope.  Has intermittent L chest pleurisy.  Lingering dry cough after COVID, no hemoptysis.  No personal or family history of blood clots. No recent surgeries, no history of stroke or bleeding issues.  Does have ulcerative colitis and is s/p colectomy ~9 years ago with J pouch.    Past Medical History  HTN HLD DM Ulcerative colitis s/p colectomy  Significant Hospital Events   2/3 admit 2/5 cdtpa  Consults:  IR, Card, PCCM  Procedures:  2/5 pulmonary artery catheter placement with tpa drip  Significant Diagnostic Tests:  CTA: saddle PE 1.6 RV/LV ratio + trop leak  Micro Data:  COVID 2/3 still +  Antimicrobials:  n/a   Interim history/subjective:  Consulted  Objective   Blood pressure (!) 141/95, pulse 76, temperature (!) 97.3 F (36.3 C), temperature source Oral, resp. rate 18, height 5\' 11"  (1.803 m), weight 91.8 kg, SpO2 96 %.        Intake/Output Summary (Last 24 hours) at 08/15/2020 1536 Last data filed at 08/15/2020 1200 Gross per 24 hour  Intake 1976.17 ml  Output 600 ml  Net 1376.17 ml   Filed Weights   08/13/20 1830 08/14/20 1716 08/15/20 8101  Weight: 90.7 kg 91.7 kg 91.8 kg    Examination: Constitutional: elderly man in no acute distress Eyes: eyes are anicteric, reactive  to light Ears, nose, mouth, and throat: mucous membranes moist, trachea midline Cardiovascular: heart sounds are regular, ext are warm to touch. no edema Respiratory: clear, no wheezing Gastrointestinal: abdomen is soft with + BS Skin: No rashes, normal turgor Neurologic: moves all 4 ext to command Psychiatric: anxious, good insight  HgB okay Trop 158 Echo RV dysfunction AKI improving  Resolved Hospital Problem list   n/a  Assessment & Plan:  Submassive PE with high risk features of syncope, RV strain in setting of recent COVID infection- discussed risks benefits of heparin alone vs. cdTPA with patient and wife.  After this discussion they elected to proceed with cdTPA, appreciate IR team's help. - ICU transfer - PA catheter directed lytics - After will need NoAC x 6 mo for provoked submassive  Afib- reactive to RV strain, consider zio once recovers  Best practice:  Diet: cardiac after procedure Pain/Anxiety/Delirium protocol (if indicated): n/a VAP protocol (if indicated): n/a DVT prophylaxis: heparin gtt GI prophylaxis: n/a Glucose control: n/a Mobility: BR Code Status: full Family Communication: updated wife Disposition: ICU for cdTPA then back to floor, hopefully home Monday    Labs   CBC: Recent Labs  Lab 08/13/20 1549 08/14/20 0208 08/15/20 0205 08/15/20 1134  WBC 13.6* 8.5 7.3 8.3  HGB 16.5 14.8 13.9 15.3  HCT 49.9 47.9 43.3 48.4  MCV 87.4 89.5 86.1 87.7  PLT 141* 134* 145* 147*  Basic Metabolic Panel: Recent Labs  Lab 08/13/20 1549 08/14/20 0208 08/15/20 0205  NA 138 138 136  K 3.9 4.2 3.9  CL 101 106 105  CO2 24 20* 22  GLUCOSE 159* 226* 112*  BUN 26* 30* 21  CREATININE 1.74* 1.57* 1.36*  CALCIUM 9.5 8.9 8.3*  MG  --   --  2.0   GFR: Estimated Creatinine Clearance: 59.4 mL/min (A) (by C-G formula based on SCr of 1.36 mg/dL (H)). Recent Labs  Lab 08/13/20 1549 08/14/20 0208 08/15/20 0205 08/15/20 1134  WBC 13.6* 8.5 7.3 8.3     Liver Function Tests: No results for input(s): AST, ALT, ALKPHOS, BILITOT, PROT, ALBUMIN in the last 168 hours. No results for input(s): LIPASE, AMYLASE in the last 168 hours. No results for input(s): AMMONIA in the last 168 hours.  ABG No results found for: PHART, PCO2ART, PO2ART, HCO3, TCO2, ACIDBASEDEF, O2SAT   Coagulation Profile: No results for input(s): INR, PROTIME in the last 168 hours.  Cardiac Enzymes: No results for input(s): CKTOTAL, CKMB, CKMBINDEX, TROPONINI in the last 168 hours.  HbA1C: Hgb A1c MFr Bld  Date/Time Value Ref Range Status  08/14/2020 02:08 AM 7.5 (H) 4.8 - 5.6 % Final    Comment:    (NOTE) Pre diabetes:          5.7%-6.4%  Diabetes:              >6.4%  Glycemic control for   <7.0% adults with diabetes     CBG: Recent Labs  Lab 08/14/20 1156 08/14/20 1724 08/14/20 2048 08/15/20 0825 08/15/20 1143  GLUCAP 146* 72 199* 103* 151*    Review of Systems:    Positive Symptoms in bold:  Constitutional fevers, chills, weight loss, fatigue, anorexia, malaise  Eyes decreased vision, double vision, eye irritation  Ears, Nose, Mouth, Throat sore throat, trouble swallowing, sinus congestion  Cardiovascular chest pain, paroxysmal nocturnal dyspnea, lower ext edema, palpitations   Respiratory SOB, cough, DOE, hemoptysis, wheezing  Gastrointestinal nausea, vomiting, diarrhea  Genitourinary burning with urination, trouble urinating  Musculoskeletal joint aches, joint swelling, back pain  Integumentary  rashes, skin lesions  Neurological focal weakness, focal numbness, trouble speaking, headaches  Psychiatric depression, anxiety, confusion  Endocrine polyuria, polydipsia, cold intolerance, heat intolerance  Hematologic abnormal bruising, abnormal bleeding, unexplained nose bleeds  Allergic/Immunologic recurrent infections, hives, swollen lymph nodes     Past Medical History  He,  has a past medical history of Diabetes mellitus without  complication ([place]), High cholesterol, and Hypertension.   Surgical History    Past Surgical History:  Procedure Laterality Date  . IR ANGIOGRAM PULMONARY BILATERAL SELECTIVE  08/15/2020  . IR ANGIOGRAM SELECTIVE EACH ADDITIONAL VESSEL  08/15/2020  . IR ANGIOGRAM SELECTIVE EACH ADDITIONAL VESSEL  08/15/2020  . IR INFUSION THROMBOL ARTERIAL INITIAL (MS)  08/15/2020  . IR INFUSION THROMBOL ARTERIAL INITIAL (MS)  08/15/2020     Social History   reports that he has never smoked. He has never used smokeless tobacco. He reports that he does not drink alcohol.   Family History   His family history is not on file.   Allergies Allergies  Allergen Reactions  . Indomethacin Diarrhea    Other reaction(s): Other (See Comments) "caused ulcerative colitis;bleeding ulcers"      Home Medications  Prior to Admission medications   Medication Sig Start Date End Date Taking? Authorizing Provider  Calcium Carb-Cholecalciferol 600-800 MG-UNIT TABS Take 1 tablet by mouth 2 (two) times daily.   Yes  [provider]  ezetimibe (ZETIA) 10 MG tablet Take 10 mg by mouth daily. 07/07/20  Yes [provider]  glimepiride (AMARYL) 4 MG tablet Take 4 mg by mouth daily with breakfast.   Yes [provider]  JANUVIA 100 MG tablet Take 100 mg by mouth daily. 07/16/20  Yes [provider]  lisinopril (ZESTRIL) 5 MG tablet Take 5 mg by mouth at bedtime. 07/07/20  Yes [provider]  omeprazole (PRILOSEC) 20 MG capsule Take 20 mg by mouth daily. 07/07/20  Yes [provider]  pravastatin (PRAVACHOL) 20 MG tablet Take 20 mg by mouth once a week. Sundays 07/07/20  Yes [provider]  TRESIBA FLEXTOUCH 100 UNIT/ML FlexTouch Pen Inject 20 Units into the skin at bedtime. 07/07/20  Yes [provider]

## 2020-08-15 NOTE — Progress Notes (Signed)
     Contacted by the patient's nurse in regards to [facility] that was scheduled for today. The patient had a CTA yesterday evening which showed extensive bilateral pulmonary emboli with saddle embolus and he has been started on Heparin. Given CTA findings, will cancel stress testing for today.   Signed, Erma Heritage, PA-C 08/15/2020, 8:07 AM Pager: [phone]

## 2020-08-15 NOTE — Progress Notes (Addendum)
[place] for heparin Indication: atrial fibrillation  Allergies  Allergen Reactions  . Indomethacin Diarrhea    Other reaction(s): Other (See Comments) "caused ulcerative colitis;bleeding ulcers"     Patient Measurements: Height: 5\' 11"  (180.3 cm) Weight: 91.8 kg (202 lb 4.8 oz) IBW/kg (Calculated) : 75.3 Heparin Dosing Weight: 90.7  Vital Signs: Temp: 97.3 F (36.3 C) (02/05 1149) Temp Source: Oral (02/05 1149) BP: 147/93 (02/05 1340) Pulse Rate: 74 (02/05 1340)  Labs: Recent Labs    08/13/20 1549 08/13/20 1549 08/13/20 1737 08/13/20 2130 08/14/20 0208 08/14/20 0609 08/14/20 1030 08/15/20 0205 08/15/20 0622 08/15/20 1134  HGB 16.5  --   --   --  14.8  --   --  13.9  --  15.3  HCT 49.9  --   --   --  47.9  --   --  43.3  --  48.4  PLT 141*  --   --   --  134*  --   --  145*  --  147*  HEPARINUNFRC  --    < >  --   --  0.33  --  0.31  --  <0.10* 0.25*  CREATININE 1.74*  --   --   --  1.57*  --   --  1.36*  --   --   TROPONINIHS 59*  --    < > 260*  --  245* 158*  --   --   --    < > = values in this interval not displayed.    Estimated Creatinine Clearance: 59.4 mL/min (A) (by C-G formula based on SCr of 1.36 mg/dL (H)).  Assessment: 70 yo male admitted for near syncope.  EMS was called and patient found to have atrial flutter with heart rate up to 140.  He is noted with bilateral PE and now on on catheter directed lysis in interventional radiology -heparin level = 0.25 (was at the low end of goal on 2/4), fibrinogen= 383  -hg= 15.3   Goal of Therapy:  Heparin level 0.3-0.7 units/ml Monitor platelets by anticoagulation protocol: Yes  Plan:  -No heparin changes now since he is on IR -Q6h heparin level, CBC and fibrinogen  Hildred Laser, PharmD Clinical Pharmacist **Pharmacist phone directory can now be found on [email or web address] ([email or web address]).  Listed under [place].

## 2020-08-15 NOTE — Progress Notes (Signed)
Progress Note  Patient Name: Bobby Fields Date of Encounter: 08/15/2020  Primary Cardiologist: Werner Lean, MD   Subjective   Patient with CT yesterday showing saddle pulmonary embolus with large clot burden.  Echo yesterday shows moderate right heart dysfunction.  Mildly elevated troponin.  No further syncopal episodes.  Inpatient Medications    Scheduled Meds: . ezetimibe  10 mg Oral Daily  . insulin aspart  0-15 Units Subcutaneous TID WC  . insulin aspart  0-5 Units Subcutaneous QHS  . insulin glargine  15 Units Subcutaneous QHS  . pantoprazole  40 mg Oral Daily  . [START ON 08/16/2020] pravastatin  20 mg Oral Q Sun   Continuous Infusions: . heparin 1,350 Units/hr (08/15/20 0537)   PRN Meds: acetaminophen, ondansetron (ZOFRAN) IV   Vital Signs    Vitals:   08/14/20 2038 08/14/20 2318 08/15/20 0633 08/15/20 0826  BP: (!) 138/95 124/84 106/76 110/84  Pulse: 78 76 76 77  Resp: 18  16 18   Temp: (!) 96.9 F (36.1 C) 97.9 F (36.6 C) 97.8 F (36.6 C) 97.8 F (36.6 C)  TempSrc: Oral Oral Oral Oral  SpO2: 100% 97% 95% 97%  Weight:   91.8 kg   Height:        Intake/Output Summary (Last 24 hours) at 08/15/2020 1010 Last data filed at 08/15/2020 0500 Gross per 24 hour  Intake 1889.09 ml  Output 600 ml  Net 1289.09 ml   Filed Weights   08/13/20 1830 08/14/20 1716 08/15/20 0633  Weight: 90.7 kg 91.7 kg 91.8 kg    Telemetry    Sinus rhythm- Personally Reviewed  ECG    No new- Personally Reviewed  Physical Exam   GEN: No acute distress.   Neck: No JVD Cardiac: RRR, no murmurs, rubs, or gallops.  Respiratory: Clear to auscultation bilaterally. GI: Soft, nontender, non-distended  MS: No edema; No deformity. Neuro:  Nonfocal  Psych: Normal affect   Labs    Chemistry Recent Labs  Lab 08/13/20 1549 08/14/20 0208 08/15/20 0205  NA 138 138 136  K 3.9 4.2 3.9  CL 101 106 105  CO2 24 20* 22  GLUCOSE 159* 226* 112*  BUN 26* 30* 21   CREATININE 1.74* 1.57* 1.36*  CALCIUM 9.5 8.9 8.3*  GFRNONAA 42* 47* 56*  ANIONGAP 13 12 9      Hematology Recent Labs  Lab 08/13/20 1549 08/14/20 0208 08/15/20 0205  WBC 13.6* 8.5 7.3  RBC 5.71 5.35 5.03  HGB 16.5 14.8 13.9  HCT 49.9 47.9 43.3  MCV 87.4 89.5 86.1  MCH 28.9 27.7 27.6  MCHC 33.1 30.9 32.1  RDW 13.4 13.6 13.6  PLT 141* 134* 145*    Cardiac EnzymesNo results for input(s): TROPONINI in the last 168 hours. No results for input(s): TROPIPOC in the last 168 hours.   BNPNo results for input(s): BNP, PROBNP in the last 168 hours.   DDimer  Recent Labs  Lab 08/14/20 1640  DDIMER 14.27*     Radiology    DG Chest 2 View  Result Date: 08/13/2020 CLINICAL DATA:  Chest pain.  Presyncope. EXAM: CHEST - 2 VIEW COMPARISON:  None. FINDINGS: The cardiomediastinal contours are normal. Subsegmental bibasilar atelectasis. Pulmonary vasculature is normal. No consolidation, pleural effusion, or pneumothorax. No acute osseous abnormalities are seen. IMPRESSION: Subsegmental bibasilar atelectasis. Electronically Signed   By: Keith Rake M.D.   On: 08/13/2020 16:26   CT ANGIO CHEST PE W OR WO CONTRAST  Result Date: 08/14/2020 CLINICAL DATA:  PE suspected, high probability EXAM: CT ANGIOGRAPHY CHEST WITH CONTRAST TECHNIQUE: Multidetector CT imaging of the chest was performed using the standard protocol during bolus administration of intravenous contrast. Multiplanar CT image reconstructions and MIPs were obtained to evaluate the vascular anatomy. CONTRAST:  147mL OMNIPAQUE IOHEXOL 350 MG/ML SOLN COMPARISON:  Chest x-ray 10/11/2020 FINDINGS: Cardiovascular: Extensive bilateral pulmonary emboli, most pronounced in the left upper and left lower lobes. Saddle embolus noted. Elevated RV to LV ratio of 1.6. This is compatible with right heart strain. Heart is normal size. Mediastinum/Nodes: No mediastinal, hilar, or axillary adenopathy. Trachea and esophagus are unremarkable. Thyroid  unremarkable. Lungs/Pleura: Lingular atelectasis or scarring. Few small scattered subpleural nodules in the right lower lobe measuring up to 4 mm. No confluent opacities or effusions. Upper Abdomen: Imaging into the upper abdomen demonstrates no acute findings. Musculoskeletal: Chest wall soft tissues are unremarkable. No acute bony abnormality. Review of the MIP images confirms the above findings. IMPRESSION: Extensive bilateral pulmonary emboli including saddle embolus. CT evidence of right heart strain (RV/LV Ratio = 1.6) consistent with at least submassive (intermediate risk) PE. The presence of right heart strain has been associated with an increased risk of morbidity and mortality. Small scattered nodules in the right lower lobe, 4 mm or less. No follow-up needed if patient is low-risk (and has no known or suspected primary neoplasm). Non-contrast chest CT can be considered in 12 months if patient is high-risk. This recommendation follows the consensus statement: Guidelines for Management of Incidental Pulmonary Nodules Detected on CT Images: From the Fleischner Society 2017; Radiology 2017; 284:228-243. Critical Value/emergent results were called by telephone at the time of interpretation on 08/14/2020 at 9:57 pm to provider Dr. Koleen Distance, who verbally acknowledged these results. Electronically Signed   By: Rolm Baptise M.D.   On: 08/14/2020 22:19   ECHOCARDIOGRAM LIMITED  Result Date: 08/14/2020    ECHOCARDIOGRAM LIMITED REPORT   Patient Name:   Bobby Fields Date of Exam: 08/14/2020 Medical Rec #:  [id number]     Height:       71.0 in Accession #:    [id number]    Weight:       200.0 lb Date of Birth:  1950-12-01      BSA:          2.109 m Patient Age:    70 years      BP:           116/83 mmHg Patient Gender: M             HR:           74 bpm. Exam Location:  Inpatient Procedure: Limited Echo, Cardiac Doppler, Limited Color Doppler and Intracardiac            Opacification Agent Indications:    Atrial  Fibrillation I48.91  History:        Patient has no prior history of Echocardiogram examinations.                 Risk Factors:Hypertension, Diabetes and Dyslipidemia. COVID 19.  Sonographer:    Darlina Sicilian RDCS Referring Phys: [id number] [place]  1. Left ventricular ejection fraction, by estimation, is 65 to 70%. The left ventricle has normal function. Left ventricular diastolic parameters were normal.  2. Right ventricular systolic function is moderately reduced. The right ventricular size is mildly enlarged.  3. The aortic valve is grossly normal. Mild aortic valve sclerosis is present, with no evidence of aortic valve stenosis. FINDINGS  Left Ventricle: Left ventricular ejection fraction, by estimation, is 65 to 70%. The left ventricle has normal function. Definity contrast agent was given IV to delineate the left ventricular endocardial borders. Left ventricular diastolic parameters were normal. Right Ventricle: The right ventricular size is mildly enlarged. Right ventricular systolic function is moderately reduced. Mitral Valve: Mild mitral annular calcification. Tricuspid Valve: Tricuspid valve regurgitation is trivial. Aortic Valve: The aortic valve is grossly normal. Mild aortic valve sclerosis is present, with no evidence of aortic valve stenosis. Aortic valve mean gradient measures 7.0 mmHg. Aortic valve peak gradient measures 12.8 mmHg. Aortic valve area, by VTI measures 1.58 cm. LEFT VENTRICLE PLAX 2D LVIDd:         4.10 cm LVIDs:         3.50 cm LV PW:         0.90 cm LV IVS:        0.90 cm LVOT diam:     2.00 cm LV SV:         41 LV SV Index:   19 LVOT Area:     3.14 cm  LV Volumes (MOD) LV vol d, MOD A2C: 61.1 ml LV vol d, MOD A4C: 41.4 ml LV vol s, MOD A2C: 25.7 ml LV vol s, MOD A4C: 12.8 ml LV SV MOD A2C:     35.4 ml LV SV MOD A4C:     41.4 ml LV SV MOD BP:      37.8 ml AORTIC VALVE AV Area (Vmax):    1.72 cm AV Area (Vmean):   1.62 cm AV Area (VTI):     1.58 cm AV Vmax:            179.00 cm/s AV Vmean:          117.000 cm/s AV VTI:            0.258 m AV Peak Grad:      12.8 mmHg AV Mean Grad:      7.0 mmHg LVOT Vmax:         97.90 cm/s LVOT Vmean:        60.500 cm/s LVOT VTI:          0.130 m LVOT/AV VTI ratio: 0.50 MITRAL VALVE MV Area (PHT): 3.39 cm    SHUNTS MV Decel Time: 224 msec    Systemic VTI:  0.13 m MV E velocity: 72.87 cm/s  Systemic Diam: 2.00 cm Candee Furbish MD Electronically signed by Candee Furbish MD Signature Date/Time: 08/14/2020/2:17:06 PM    Final     Cardiac Studies   August 14, 2020 echo personally reviewed Normal left ventricular function, 65% Moderately reduced right ventricular function     Assessment & Plan    #Massive pulmonary embolus Patient with syncope, evidence of significant right heart dysfunction, elevated troponin in setting of saddle pulmonary embolus.  I discussed his case with the pulmonologist and heart failure teams.  Plan for catheter-based treatment of the PE with lytics+/-aspiration.  He will be transferred to the ICU.  Keep n.p.o.   For questions or updates, please contact [place] Please consult [email or web address] for contact info under Cardiology/STEMI.      Signed, Lars Mage, MD  08/15/2020, 10:10 AM

## 2020-08-15 NOTE — Procedures (Signed)
Pre procedural Dx: Submassive PE Post procedural Dx: Same  Successful fluoroscopic guided initiation of bilateral catheter directed pulmonary arterial lysis for sub massive pulmonary embolism and right-sided heart strain.  Acquired pressure measurements:   Main  pulmonary artery - 53/17; mean - 27 (normal: < 25/10)   EBL: None.  Complications: None immediate.  Access: R common femoral vein x2  PLAN:   - Initiate 12 hour tPA infusion.  - A.M. chest radiograph to document infusion catheter positioning.  - Will perform bedside pressure measurements and catheter removal tomorrow morning.  Ronny Bacon, MD Pager #: [phone]

## 2020-08-15 NOTE — Consult Note (Signed)
Chief Complaint: Patient was seen in consultation today for pulmonary embolism treatment   Referring Physician(s): Dr. Tamala Julian   Supervising Physician: Sandi Mariscal  Patient Status: [facility] - In-pt  History of Present Illness: Bobby Fields is a 70 y.o. male with a medical history significant for DM, HTN, ulcerative colitis (s/p total colectomy with J-pouch), kidney stones requiring stent, and recent COVID infection approximately 1 month ago. He presented to the ED 08/13/20 with a near syncopal episode and was found to be in atrial flutter with RVR. A 2D echo was performed and this showed a normal EF but moderately decreased RV function with concern for a PE. Additional imaging was obtained.   CT angio chest 08/14/20 IMPRESSION: Extensive bilateral pulmonary emboli including saddle embolus. CT evidence of right heart strain (RV/LV Ratio = 1.6) consistent with at least submassive (intermediate risk) PE. The presence of right heart strain has been associated with an increased risk of morbidity and mortality.  Small scattered nodules in the right lower lobe, 4 mm or less. No follow-up needed if patient is low-risk (and has no known or suspected primary neoplasm). Non-contrast chest CT can be considered in 12 months if patient is high-risk.   Interventional Radiology has been asked to evaluate this patient for an image-guided catheter-directed lysis of saddle pulmonary embolus. This case has been reviewed and procedure approved by Dr. Pascal Lux.   Past Medical History:  Diagnosis Date  . Diabetes mellitus without complication ([place])   . High cholesterol   . Hypertension     History reviewed. No pertinent surgical history.  Allergies: Indomethacin  Medications: Prior to Admission medications   Medication Sig Start Date End Date Taking? Authorizing Provider  Calcium Carb-Cholecalciferol 600-800 MG-UNIT TABS Take 1 tablet by mouth 2 (two) times daily.   Yes [provider]   ezetimibe (ZETIA) 10 MG tablet Take 10 mg by mouth daily. 07/07/20  Yes [provider]  glimepiride (AMARYL) 4 MG tablet Take 4 mg by mouth daily with breakfast.   Yes [provider]  JANUVIA 100 MG tablet Take 100 mg by mouth daily. 07/16/20  Yes [provider]  lisinopril (ZESTRIL) 5 MG tablet Take 5 mg by mouth at bedtime. 07/07/20  Yes [provider]  omeprazole (PRILOSEC) 20 MG capsule Take 20 mg by mouth daily. 07/07/20  Yes [provider]  pravastatin (PRAVACHOL) 20 MG tablet Take 20 mg by mouth once a week. Sundays 07/07/20  Yes [provider]  TRESIBA FLEXTOUCH 100 UNIT/ML FlexTouch Pen Inject 20 Units into the skin at bedtime. 07/07/20  Yes [provider]     No family history on file.  Social History   Socioeconomic History  . Marital status: Married    Spouse name: Not on file  . Number of children: Not on file  . Years of education: Not on file  . Highest education level: Not on file  Occupational History  . Not on file  Tobacco Use  . Smoking status: Never Smoker  . Smokeless tobacco: Never Used  Substance and Sexual Activity  . Alcohol use: Never  . Drug use: Not on file  . Sexual activity: Not on file  Other Topics Concern  . Not on file  Social History Narrative  . Not on file   Social Determinants of Health   Financial Resource Strain: Not on file  Food Insecurity: Not on file  Transportation Needs: Not on file  Physical Activity: Not on file  Stress: Not  on file  Social Connections: Not on file    Review of Systems: A 12 point ROS discussed and pertinent positives are indicated in the HPI above.  All other systems are negative.  Review of Systems  Constitutional: Negative for appetite change and fatigue.  Respiratory: Positive for chest tightness. Negative for cough and shortness of breath.   Cardiovascular: Negative for chest pain and leg swelling.  Gastrointestinal: Negative for  abdominal pain, diarrhea, nausea and vomiting.  Musculoskeletal: Negative for back pain.  Neurological: Negative for dizziness and headaches.    Vital Signs: BP 110/84 (BP Location: Left Arm)   Pulse 77   Temp 97.8 F (36.6 C) (Oral)   Resp 18   Ht 5\' 11"  (1.803 m)   Wt 202 lb 4.8 oz (91.8 kg)   SpO2 97%   BMI 28.22 kg/m   Physical Exam Constitutional:      General: He is not in acute distress.    Appearance: He is not ill-appearing.  HENT:     Mouth/Throat:     Mouth: Mucous membranes are moist.     Pharynx: Oropharynx is clear.  Cardiovascular:     Rate and Rhythm: Normal rate and regular rhythm.     Pulses: Normal pulses.     Heart sounds: Normal heart sounds.  Pulmonary:     Effort: Pulmonary effort is normal.     Breath sounds: Normal breath sounds.  Abdominal:     General: Bowel sounds are normal.     Palpations: Abdomen is soft.  Musculoskeletal:        General: Normal range of motion.  Skin:    General: Skin is warm and dry.  Neurological:     Mental Status: He is alert and oriented to person, place, and time.     Imaging: DG Chest 2 View  Result Date: 08/13/2020 CLINICAL DATA:  Chest pain.  Presyncope. EXAM: CHEST - 2 VIEW COMPARISON:  None. FINDINGS: The cardiomediastinal contours are normal. Subsegmental bibasilar atelectasis. Pulmonary vasculature is normal. No consolidation, pleural effusion, or pneumothorax. No acute osseous abnormalities are seen. IMPRESSION: Subsegmental bibasilar atelectasis. Electronically Signed   By: Keith Rake M.D.   On: 08/13/2020 16:26   CT ANGIO CHEST PE W OR WO CONTRAST  Result Date: 08/14/2020 CLINICAL DATA:  PE suspected, high probability EXAM: CT ANGIOGRAPHY CHEST WITH CONTRAST TECHNIQUE: Multidetector CT imaging of the chest was performed using the standard protocol during bolus administration of intravenous contrast. Multiplanar CT image reconstructions and MIPs were obtained to evaluate the vascular anatomy.  CONTRAST:  170mL OMNIPAQUE IOHEXOL 350 MG/ML SOLN COMPARISON:  Chest x-ray 10/11/2020 FINDINGS: Cardiovascular: Extensive bilateral pulmonary emboli, most pronounced in the left upper and left lower lobes. Saddle embolus noted. Elevated RV to LV ratio of 1.6. This is compatible with right heart strain. Heart is normal size. Mediastinum/Nodes: No mediastinal, hilar, or axillary adenopathy. Trachea and esophagus are unremarkable. Thyroid unremarkable. Lungs/Pleura: Lingular atelectasis or scarring. Few small scattered subpleural nodules in the right lower lobe measuring up to 4 mm. No confluent opacities or effusions. Upper Abdomen: Imaging into the upper abdomen demonstrates no acute findings. Musculoskeletal: Chest wall soft tissues are unremarkable. No acute bony abnormality. Review of the MIP images confirms the above findings. IMPRESSION: Extensive bilateral pulmonary emboli including saddle embolus. CT evidence of right heart strain (RV/LV Ratio = 1.6) consistent with at least submassive (intermediate risk) PE. The presence of right heart strain has been associated with an increased risk of morbidity and mortality.  Small scattered nodules in the right lower lobe, 4 mm or less. No follow-up needed if patient is low-risk (and has no known or suspected primary neoplasm). Non-contrast chest CT can be considered in 12 months if patient is high-risk. This recommendation follows the consensus statement: Guidelines for Management of Incidental Pulmonary Nodules Detected on CT Images: From the Fleischner Society 2017; Radiology 2017; 284:228-243. Critical Value/emergent results were called by telephone at the time of interpretation on 08/14/2020 at 9:57 pm to provider Dr. Koleen Distance, who verbally acknowledged these results. Electronically Signed   By: Rolm Baptise M.D.   On: 08/14/2020 22:19   ECHOCARDIOGRAM LIMITED  Result Date: 08/14/2020    ECHOCARDIOGRAM LIMITED REPORT   Patient Name:   Bobby Fields Date of Exam:  08/14/2020 Medical Rec #:  [id number]     Height:       71.0 in Accession #:    [id number]    Weight:       200.0 lb Date of Birth:  03/20/1951      BSA:          2.109 m Patient Age:    27 years      BP:           116/83 mmHg Patient Gender: M             HR:           74 bpm. Exam Location:  Inpatient Procedure: Limited Echo, Cardiac Doppler, Limited Color Doppler and Intracardiac            Opacification Agent Indications:    Atrial Fibrillation I48.91  History:        Patient has no prior history of Echocardiogram examinations.                 Risk Factors:Hypertension, Diabetes and Dyslipidemia. COVID 19.  Sonographer:    Darlina Sicilian RDCS Referring Phys: [id number] [place]  1. Left ventricular ejection fraction, by estimation, is 65 to 70%. The left ventricle has normal function. Left ventricular diastolic parameters were normal.  2. Right ventricular systolic function is moderately reduced. The right ventricular size is mildly enlarged.  3. The aortic valve is grossly normal. Mild aortic valve sclerosis is present, with no evidence of aortic valve stenosis. FINDINGS  Left Ventricle: Left ventricular ejection fraction, by estimation, is 65 to 70%. The left ventricle has normal function. Definity contrast agent was given IV to delineate the left ventricular endocardial borders. Left ventricular diastolic parameters were normal. Right Ventricle: The right ventricular size is mildly enlarged. Right ventricular systolic function is moderately reduced. Mitral Valve: Mild mitral annular calcification. Tricuspid Valve: Tricuspid valve regurgitation is trivial. Aortic Valve: The aortic valve is grossly normal. Mild aortic valve sclerosis is present, with no evidence of aortic valve stenosis. Aortic valve mean gradient measures 7.0 mmHg. Aortic valve peak gradient measures 12.8 mmHg. Aortic valve area, by VTI measures 1.58 cm. LEFT VENTRICLE PLAX 2D LVIDd:         4.10 cm LVIDs:         3.50 cm LV  PW:         0.90 cm LV IVS:        0.90 cm LVOT diam:     2.00 cm LV SV:         41 LV SV Index:   19 LVOT Area:     3.14 cm  LV Volumes (MOD) LV vol d, MOD A2C: 61.1 ml LV vol  d, MOD A4C: 41.4 ml LV vol s, MOD A2C: 25.7 ml LV vol s, MOD A4C: 12.8 ml LV SV MOD A2C:     35.4 ml LV SV MOD A4C:     41.4 ml LV SV MOD BP:      37.8 ml AORTIC VALVE AV Area (Vmax):    1.72 cm AV Area (Vmean):   1.62 cm AV Area (VTI):     1.58 cm AV Vmax:           179.00 cm/s AV Vmean:          117.000 cm/s AV VTI:            0.258 m AV Peak Grad:      12.8 mmHg AV Mean Grad:      7.0 mmHg LVOT Vmax:         97.90 cm/s LVOT Vmean:        60.500 cm/s LVOT VTI:          0.130 m LVOT/AV VTI ratio: 0.50 MITRAL VALVE MV Area (PHT): 3.39 cm    SHUNTS MV Decel Time: 224 msec    Systemic VTI:  0.13 m MV E velocity: 72.87 cm/s  Systemic Diam: 2.00 cm Candee Furbish MD Electronically signed by Candee Furbish MD Signature Date/Time: 08/14/2020/2:17:06 PM    Final     Labs:  CBC: Recent Labs    08/13/20 1549 08/14/20 0208 08/15/20 0205  WBC 13.6* 8.5 7.3  HGB 16.5 14.8 13.9  HCT 49.9 47.9 43.3  PLT 141* 134* 145*    COAGS: No results for input(s): INR, APTT in the last 8760 hours.  BMP: Recent Labs    08/13/20 1549 08/14/20 0208 08/15/20 0205  NA 138 138 136  K 3.9 4.2 3.9  CL 101 106 105  CO2 24 20* 22  GLUCOSE 159* 226* 112*  BUN 26* 30* 21  CALCIUM 9.5 8.9 8.3*  CREATININE 1.74* 1.57* 1.36*  GFRNONAA 42* 47* 56*    LIVER FUNCTION TESTS: No results for input(s): BILITOT, AST, ALT, ALKPHOS, PROT, ALBUMIN in the last 8760 hours.  TUMOR MARKERS: No results for input(s): AFPTM, CEA, CA199, CHROMGRNA in the last 8760 hours.  Assessment and Plan:  Saddle pulmonary embolus: Bobby Fields, 70 year old male, is tentatively scheduled for 08/15/20 for an image-guided catheter-directed lysis of saddle pulmonary embolism. He is no longer on Covid precautions.   Risks and benefits discussed with the patient including, but  not limited to bleeding, possible life threatening bleeding and need for blood product transfusion, vascular injury, stroke, contrast induced renal failure, limb loss and infection.  All of the patient's questions were answered, patient is agreeable to proceed.  Consent signed and in the chart.   Thank you for this interesting consult.  I greatly enjoyed meeting Bobby Fields and look forward to participating in their care.  A copy of this report was sent to the requesting provider on this date.  Electronically Signed: Soyla Dryer, AGACNP-BC [phone] 08/15/2020, 10:41 AM   I spent a total of 40 Minutes    in face to face in clinical consultation, greater than 50% of which was counseling/coordinating care for image-guided catheter-directed lysis of pulmonary embolism

## 2020-08-15 NOTE — Hospital Course (Signed)
Patient with some dizziness with getting up yesterday but feels this is improved this morning. Wife at bedside this morning. Discussed finding of saddle embolism and treatment with heparin and will discuss if there is furfur management with cardiology. Discussed potential treatment for aflutter with cardioversion in the future. Wife notes HR has been going down during sleep but did notice HR slow while awake this morning, not having any symptoms and improved back to 70  quickly.

## 2020-08-15 NOTE — Progress Notes (Signed)
[place] for heparin Indication: atrial fibrillation  Allergies  Allergen Reactions  . Indomethacin Diarrhea    Other reaction(s): Other (See Comments) "caused ulcerative colitis;bleeding ulcers"     Patient Measurements: Height: 5\' 11"  (180.3 cm) Weight: 91.8 kg (202 lb 4.8 oz) IBW/kg (Calculated) : 75.3 Heparin Dosing Weight: 90.7  Vital Signs: Temp: 97.8 F (36.6 C) (02/05 0826) Temp Source: Oral (02/05 0826) BP: 110/84 (02/05 0826) Pulse Rate: 77 (02/05 0826)  Labs: Recent Labs    08/13/20 1549 08/13/20 1737 08/13/20 2130 08/14/20 0208 08/14/20 0609 08/14/20 1030 08/15/20 0205 08/15/20 0622  HGB 16.5  --   --  14.8  --   --  13.9  --   HCT 49.9  --   --  47.9  --   --  43.3  --   PLT 141*  --   --  134*  --   --  145*  --   HEPARINUNFRC  --   --   --  0.33  --  0.31  --  <0.10*  CREATININE 1.74*  --   --  1.57*  --   --  1.36*  --   TROPONINIHS 59*   < > 260*  --  245* 158*  --   --    < > = values in this interval not displayed.    Estimated Creatinine Clearance: 59.4 mL/min (A) (by C-G formula based on SCr of 1.36 mg/dL (H)).  Assessment: 70 yo male admitted for near syncope.  EMS was called and patient found to have atrial flutter with heart rate up to 140.  Patient does not have history of atrial flutter or atrial fibrillation.  No anticoagulation PTA.  Patient did state he felt his heart beating a little differently a few months ago- likely not acute atrial fibrillation.  Cardiology following.  2/4 D-dimer was 14.27, CTA found extensive bilateral pulmonary emboli including saddle embolus. CT evidence of right heart strain consistent with at least submassive (intermediate risk) PE.  Heparin level undetectable this morning. Spoke with RN who stated heparin gtt was off for ~2 hours overnight. Nurse overnight thinks lab may have turned off the gtt when they came to draw AM labs ~0200, nurse restarted gtt when she  discovered it off ~0400. Heparin level was drawn 0622.   Hgb wnl. Plt 145. No reported bleeding.  Goal of Therapy:  Heparin level 0.3-0.7 units/ml Monitor platelets by anticoagulation protocol: Yes  Plan:  -Continue heparin at 1350 units/hr  -Will recheck 6h heparin level -Monitor heparin level, CBC and S/S of bleeding daily  -Follow up cardiology plans - current plan to continue IV heparin with plan to transition to apixaban when all procedures are completed -Consult to CM for apixaban cost placed   Rebbeca Paul, PharmD PGY1 Pharmacy Resident 08/15/2020 8:45 AM  Please check [email or web address] for unit-specific pharmacy phone numbers.

## 2020-08-15 NOTE — Sedation Documentation (Signed)
PAP 53/17 (27).

## 2020-08-15 NOTE — Progress Notes (Signed)
Subjective: HD 2 Overnight, no acute events reported.  This morning, patient was evaluated at bedside. He is resting comfortably in bed and does not appear to be in acute distress. Wife is at bedside. He denies any lightheadedness/dizziness or chest pain at this time.  Objective:  Vital signs in last 24 hours: Vitals:   08/14/20 2318 08/15/20 0633 08/15/20 0800 08/15/20 0826  BP: 124/84 106/76  110/84  Pulse: 76 76 77 77  Resp:  16  18  Temp: 97.9 F (36.6 C) 97.8 F (36.6 C)  97.8 F (36.6 C)  TempSrc: Oral Oral  Oral  SpO2: 97% 95% 97% 97%  Weight:  91.8 kg    Height:       CBC Latest Ref Rng & Units 08/15/2020 08/14/2020 08/13/2020  WBC 4.0 - 10.5 K/uL 7.3 8.5 13.6(H)  Hemoglobin 13.0 - 17.0 g/dL 13.9 14.8 16.5  Hematocrit 39.0 - 52.0 % 43.3 47.9 49.9  Platelets 150 - 400 K/uL 145(L) 134(L) 141(L)   BMP Latest Ref Rng & Units 08/15/2020 08/14/2020 08/13/2020  Glucose 70 - 99 mg/dL 112(H) 226(H) 159(H)  BUN 8 - 23 mg/dL 21 30(H) 26(H)  Creatinine 0.61 - 1.24 mg/dL 1.36(H) 1.57(H) 1.74(H)  Sodium 135 - 145 mmol/L 136 138 138  Potassium 3.5 - 5.1 mmol/L 3.9 4.2 3.9  Chloride 98 - 111 mmol/L 105 106 101  CO2 22 - 32 mmol/L 22 20(L) 24  Calcium 8.9 - 10.3 mg/dL 8.3(L) 8.9 9.5    Physical Exam  Constitutional: Appears well-developed and well-nourished. No distress.  Cardiovascular: Normal rate, regular rhythm, S1 and S2 present, no murmurs, rubs, gallops.  Distal pulses intact Respiratory: No respiratory distress, no accessory muscle use.  Effort is normal.  Lungs are clear to auscultation bilaterally. GI: Nondistended, soft, nontender to palpation, normal active bowel sounds Musculoskeletal: Normal bulk and tone.  No peripheral edema noted. Neurological: Is alert and oriented x4, no apparent focal deficits noted. Skin: Warm and dry.  No rash, erythema, lesions noted.  Echo 08/14/2020: IMPRESSIONS  1. Left ventricular ejection fraction, by estimation, is 65 to 70%. The  left  ventricle has normal function. Left ventricular diastolic parameters  were normal.  2. Right ventricular systolic function is moderately reduced. The right  ventricular size is mildly enlarged.  3. The aortic valve is grossly normal. Mild aortic valve sclerosis is  present, with no evidence of aortic valve stenosis.   CTA CHEST 08/14/2020: IMPRESSION: Extensive bilateral pulmonary emboli including saddle embolus. CT evidence of right heart strain (RV/LV Ratio = 1.6) consistent with at least submassive (intermediate risk) PE. The presence of right heart strain has been associated with an increased risk of morbidity and mortality. Small scattered nodules in the right lower lobe, 4 mm or less. No follow-up needed if patient is low-risk (and has no known or suspected primary neoplasm). Non-contrast chest CT can be considered in 12 months if patient is high-risk.   Assessment/Plan:  [name] is a 70 y.o. male with hx of type 2 diabetes, hyperlipidemia, ulcerative colitis status post colectomy 2013, Covid infection 1 month ago, admitted for atrial flutter found to have submassive PE.   Submassive Pulmonary Embolism Patient presented with near syncopal episode found to have new onset atrial flutter.  Echo done yesterday significant for ventricular systolic dysfunction.  Follow-up CTA with extensive bilateral pulmonary emboli including saddle embolus.  Concern for submassive PE in setting of right heart strain.  Patient is hemodynamically stable and has not had  any further near syncope episodes.  -Transfer to ICU for catheter-based treatment of PE with lytics  Atrial flutter: Patient presented with newly diagnosed atrial flutter in setting of near syncope episode.  His heart rate is still controlled without any medications.  Patient is on heparin drip.  -Cardiology consulted, appreciate recommendations -Continue heparin drip -Consider outpatient cardiac monitoring and ablation  AKI  on CKD: Patient initially presented with serum creatinine elevated to 1.7, likely prerenal in the setting of decreased oral intake prior to admission.  This is improved to 1.5 (baseline) with fluid resuscitation.  Patient is tolerating diet at this time. -Continue to monitor   Code status: FULL Diet: Carb modified DVT Prophylaxis: heparin gtt Family Communication: Wife updated at bedside  Prior to Admission Living Arrangement: Home Anticipated Discharge Location: Home Barriers to Discharge: Continued medical management  Dispo: Anticipated discharge in approximately 2-3 day(s).   Harvie Heck, MD  [facility] PGY-2 08/15/2020, 11:10 AM Pager: [phone] After 5pm on weekdays and 1pm on weekends: On Call pager [phone]

## 2020-08-15 NOTE — Progress Notes (Signed)
Full consult to follow. High risk submassive PE induced by recent COVID infection. Good functional baseline. Reasonable to do cdTPA, discussed with Dr. Quentin Ore and Francene Castle MD PCCM

## 2020-08-15 NOTE — Progress Notes (Signed)
[place] for heparin Indication: atrial fibrillation  Allergies  Allergen Reactions  . Indomethacin Diarrhea    Other reaction(s): Other (See Comments) "caused ulcerative colitis;bleeding ulcers"     Patient Measurements: Height: 5\' 11"  (180.3 cm) Weight: 91.8 kg (202 lb 4.8 oz) IBW/kg (Calculated) : 75.3 Heparin Dosing Weight: 90.7  Vital Signs: Temp: 97.3 F (36.3 C) (02/05 1539) Temp Source: Axillary (02/05 1539) BP: 138/95 (02/05 1730) Pulse Rate: 79 (02/05 1900)  Labs: Recent Labs    08/13/20 1549 08/13/20 1549 08/13/20 1737 08/13/20 2130 08/14/20 0208 08/14/20 0609 08/14/20 1030 08/15/20 0205 08/15/20 0622 08/15/20 1134 08/15/20 1828  HGB 16.5  --   --   --  14.8  --   --  13.9  --  15.3 15.5  HCT 49.9  --   --   --  47.9  --   --  43.3  --  48.4 47.3  PLT 141*  --   --   --  134*  --   --  145*  --  147* 144*  HEPARINUNFRC  --    < >  --   --  0.33  --  0.31  --  <0.10* 0.25* 0.23*  CREATININE 1.74*  --   --   --  1.57*  --   --  1.36*  --   --   --   TROPONINIHS 59*  --    < > 260*  --  245* 158*  --   --   --   --    < > = values in this interval not displayed.    Estimated Creatinine Clearance: 59.4 mL/min (A) (by C-G formula based on SCr of 1.36 mg/dL (H)).  Assessment: 70 yo male admitted for near syncope.  EMS was called and patient found to have atrial flutter with heart rate up to 140.  He is noted with bilateral PE and now on on catheter directed lysis in interventional radiology.  Patient continues on tpa and IV heparin. Heparin level low this evening at 0.23. fibrinogen/cbc stable. No bleeding issues noted.    Goal of Therapy:  Heparin level 0.3-0.7 units/ml Monitor platelets by anticoagulation protocol: Yes  Plan:  -Increase heparin to 1500 units/hr -Q6h heparin level, CBC and fibrinogen  Erin Hearing PharmD., BCPS Clinical Pharmacist 08/15/2020 7:57 PM

## 2020-08-16 ENCOUNTER — Inpatient Hospital Stay (HOSPITAL_COMMUNITY): Payer: Medicare HMO

## 2020-08-16 DIAGNOSIS — I2602 Saddle embolus of pulmonary artery with acute cor pulmonale: Secondary | ICD-10-CM | POA: Diagnosis not present

## 2020-08-16 DIAGNOSIS — I2699 Other pulmonary embolism without acute cor pulmonale: Secondary | ICD-10-CM | POA: Diagnosis not present

## 2020-08-16 DIAGNOSIS — I483 Typical atrial flutter: Secondary | ICD-10-CM | POA: Diagnosis not present

## 2020-08-16 HISTORY — PX: IR THROMB F/U EVAL ART/VEN FINAL DAY (MS): IMG5379

## 2020-08-16 LAB — CBC
HCT: 43.5 % (ref 39.0–52.0)
HCT: 41.8 % (ref 39.0–52.0)
HCT: 45.3 % (ref 39.0–52.0)
Hemoglobin: 14.2 g/dL (ref 13.0–17.0)
Hemoglobin: 13.9 g/dL (ref 13.0–17.0)
Hemoglobin: 14.5 g/dL (ref 13.0–17.0)
MCH: 28 pg (ref 26.0–34.0)
MCH: 28.3 pg (ref 26.0–34.0)
MCH: 27.6 pg (ref 26.0–34.0)
MCHC: 32.6 g/dL (ref 30.0–36.0)
MCHC: 33.3 g/dL (ref 30.0–36.0)
MCHC: 32 g/dL (ref 30.0–36.0)
MCV: 85.8 fL (ref 80.0–100.0)
MCV: 85.1 fL (ref 80.0–100.0)
MCV: 86.1 fL (ref 80.0–100.0)
Platelets: 125 10*3/uL — ABNORMAL LOW (ref 150–400)
Platelets: 129 10*3/uL — ABNORMAL LOW (ref 150–400)
Platelets: 121 10*3/uL — ABNORMAL LOW (ref 150–400)
RBC: 5.07 MIL/uL (ref 4.22–5.81)
RBC: 4.91 MIL/uL (ref 4.22–5.81)
RBC: 5.26 MIL/uL (ref 4.22–5.81)
RDW: 13.5 % (ref 11.5–15.5)
RDW: 13.6 % (ref 11.5–15.5)
RDW: 13.4 % (ref 11.5–15.5)
WBC: 10.2 10*3/uL (ref 4.0–10.5)
WBC: 10.1 10*3/uL (ref 4.0–10.5)
WBC: 9.6 10*3/uL (ref 4.0–10.5)
nRBC: 0 % (ref 0.0–0.2)
nRBC: 0 % (ref 0.0–0.2)
nRBC: 0 % (ref 0.0–0.2)

## 2020-08-16 LAB — BASIC METABOLIC PANEL
Anion gap: 11 (ref 5–15)
BUN: 14 mg/dL (ref 8–23)
CO2: 21 mmol/L — ABNORMAL LOW (ref 22–32)
Calcium: 8.3 mg/dL — ABNORMAL LOW (ref 8.9–10.3)
Chloride: 104 mmol/L (ref 98–111)
Creatinine, Ser: 1.31 mg/dL — ABNORMAL HIGH (ref 0.61–1.24)
GFR, Estimated: 59 mL/min — ABNORMAL LOW (ref >60)
Glucose, Bld: 96 mg/dL (ref 70–99)
Potassium: 4.1 mmol/L (ref 3.5–5.1)
Sodium: 136 mmol/L (ref 135–145)

## 2020-08-16 LAB — FIBRINOGEN
Fibrinogen: 267 mg/dL (ref 210–475)
Fibrinogen: 278 mg/dL (ref 210–475)
Fibrinogen: 363 mg/dL (ref 210–475)

## 2020-08-16 LAB — GLUCOSE, CAPILLARY
Glucose-Capillary: 96 mg/dL (ref 70–99)
Glucose-Capillary: 150 mg/dL — ABNORMAL HIGH (ref 70–99)
Glucose-Capillary: 147 mg/dL — ABNORMAL HIGH (ref 70–99)
Glucose-Capillary: 191 mg/dL — ABNORMAL HIGH (ref 70–99)

## 2020-08-16 LAB — HEPARIN LEVEL (UNFRACTIONATED): Heparin Unfractionated: 0.4 IU/mL (ref 0.30–0.70)

## 2020-08-16 MED ORDER — APIXABAN 5 MG PO TABS
10.0000 mg | ORAL_TABLET | Freq: Two times a day (BID) | ORAL | Status: DC
  Administered 2020-08-16 – 2020-08-18 (×4): 10 mg via ORAL
  Filled 2020-08-16 (×4): qty 2

## 2020-08-16 MED ORDER — APIXABAN 5 MG PO TABS: 5.0000 mg | ORAL_TABLET | Freq: Two times a day (BID) | ORAL | Status: DC

## 2020-08-16 NOTE — Progress Notes (Signed)
   NAME:  Bobby Fields, MRN:  [id number], DOB:  [birth year], LOS: 2 ADMISSION DATE:  08/13/2020, CONSULTATION DATE:  08/15/20 REFERRING MD:  Quentin Ore, CHIEF COMPLAINT:  syncope   Brief History   70 year old man with hx of COVID 1 month ago presenting with syncope found to have PE with right heart strain.  History of present illness   70 year old man with hx of HTN, HLD, DM, COVID infection ~1 mo ago presenting after passing out.  Found in ER to be in [place] yesterday with initial thoughts that this was symptomatic atrial flutter. Admitted to PCU where subsequent workup revealed elevated D dimer and CTA chest showed saddle PE with R heart strain. Currently feels well, denies any further presyncope.  Has intermittent L chest pleurisy.  Lingering dry cough after COVID, no hemoptysis.  No personal or family history of blood clots. No recent surgeries, no history of stroke or bleeding issues.  Does have ulcerative colitis and is s/p colectomy ~9 years ago with J pouch.    Past Medical History  HTN HLD DM Ulcerative colitis s/p colectomy  Significant Hospital Events   2/3 admit 2/5 cdtpa  Consults:  IR, Card, PCCM  Procedures:  2/5 pulmonary artery catheter placement with tpa drip  Significant Diagnostic Tests:  CTA: saddle PE 1.6 RV/LV ratio + trop leak  Micro Data:  COVID 2/3 still +  Antimicrobials:  n/a   Interim history/subjective:  No events. PA pressures a bit better after catheter-directed lysis. Denies chest pain, dizziness, HA. PA catheters currently being taken out.  Objective   Blood pressure 113/82, pulse 78, temperature 98.9 F (37.2 C), resp. rate 19, height 5\' 11"  (1.803 m), weight 93 kg, SpO2 93 %.        Intake/Output Summary (Last 24 hours) at 08/16/2020 0945 Last data filed at 08/16/2020 4097 Gross per 24 hour  Intake 1688.35 ml  Output 1420 ml  Net 268.35 ml   Filed Weights   08/14/20 1716 08/15/20 3532 08/16/20 0600  Weight: 91.7 kg 91.8 kg 93 kg     Examination: Constitutional: no acute distress  Eyes: EOMI, pupils equal Ears, nose, mouth, and throat: MMM, trachea midline Cardiovascular: RRR, ext warm, aflutter on monitor Respiratory: clear, no wheezing Gastrointestinal: soft, hypoactive BS Skin: No rashes, normal turgor, R groin CDI with catheters in place Neurologic: moves all 4 ext to command Psychiatric: RASS 0, good insight  Cr stable CBC stable  Resolved Hospital Problem list   n/a  Assessment & Plan:  Submassive PE high risk provoked from covid infection, s/p cdTPA, improved PA pressures on cath,  Aflutter- reactive to above suspected but should monitor for resolution - Start eliquis tonight, plan 6 months then can do baby aspirin, discussed with wife - If he catches COVID again he should take at least a month of AC - Should take it easy for a week then increase activity as tolerated - Cardiology f/u for aflutter - Could consider echo 3 months down the line but no good evidence for this - Transfer to cardiac tele this afternoon, may home tomorrow or Tuesday per IMTS  Erskine Emery MD PCCM

## 2020-08-16 NOTE — Procedures (Signed)
Pre procedural Dx: Submassive Pulmonary Embolism Post procedural Dx: Same  Technically successful completion of bilateral catheter directed pulmonary arterial thrombolysis with subjective improvement in work of breathing and slight reduction in peak systolic pressure within the main pulmonary artery.     Preprocedural main pulmonary artery - 53/17; mean - 27 (normal: < 25/10) Postprocedural main pulmonary artery - 48/22; mean - 31  EBL: None.  Complications: None immediate.   Ronny Bacon, MD Pager #: [phone]

## 2020-08-16 NOTE — Discharge Instructions (Addendum)
Information on my medicine - ELIQUIS (apixaban)  Why was Eliquis prescribed for you? Eliquis was prescribed to treat blood clots that may have been found in the veins of your legs (deep vein thrombosis) or in your lungs (pulmonary embolism) and to reduce the risk of them occurring again.  What do You need to know about Eliquis ? The starting dose is 10 mg (two 5 mg tablets) taken TWICE daily for the FIRST SEVEN (7) DAYS, then on 2/14 the dose is reduced to ONE 5 mg tablet taken TWICE daily.  Eliquis may be taken with or without food.   Try to take the dose about the same time in the morning and in the evening. If you have difficulty swallowing the tablet whole please discuss with your pharmacist how to take the medication safely.  Take Eliquis exactly as prescribed and DO NOT stop taking Eliquis without talking to the doctor who prescribed the medication.  Stopping may increase your risk of developing a new blood clot.  Refill your prescription before you run out.  After discharge, you should have regular check-up appointments with your healthcare provider that is prescribing your Eliquis.    What do you do if you miss a dose? If a dose of ELIQUIS is not taken at the scheduled time, take it as soon as possible on the same day and twice-daily administration should be resumed. The dose should not be doubled to make up for a missed dose.  Important Safety Information A possible side effect of Eliquis is bleeding. You should call your healthcare provider right away if you experience any of the following: ? Bleeding from an injury or your nose that does not stop. ? Unusual colored urine (red or dark brown) or unusual colored stools (red or black). ? Unusual bruising for unknown reasons. ? A serious fall or if you hit your head (even if there is no bleeding).  Some medicines may interact with Eliquis and might increase your risk of bleeding or clotting while on Eliquis. To help avoid  this, consult your healthcare provider or pharmacist prior to using any new prescription or non-prescription medications, including herbals, vitamins, non-steroidal anti-inflammatory drugs (NSAIDs) and supplements.  This website has more information on Eliquis (apixaban): http://www.eliquis.com/eliquis/home

## 2020-08-16 NOTE — Progress Notes (Signed)
Referring Physician(s): Dr. Tamala Julian   Supervising Physician: Sandi Mariscal  Patient Status:  [facility] - In-pt  Chief Complaint: Saddle pulmonary embolus s/p catheter-directed thrombolysis 08/15/20 with Dr. Pascal Lux  Subjective: Patient in bed, awake and alert; his wife is at the bedside. He denies pain or discomfort and states he feels better. His right femoral infusion catheters were removed this morning around 0930. His bedrest is up at 1400 and he has plans to transfer out of the ICU.     Allergies: Indomethacin  Medications: Prior to Admission medications   Medication Sig Start Date End Date Taking? Authorizing Provider  Calcium Carb-Cholecalciferol 600-800 MG-UNIT TABS Take 1 tablet by mouth 2 (two) times daily.   Yes [provider]  ezetimibe (ZETIA) 10 MG tablet Take 10 mg by mouth daily. 07/07/20  Yes [provider]  glimepiride (AMARYL) 4 MG tablet Take 4 mg by mouth daily with breakfast.   Yes [provider]  JANUVIA 100 MG tablet Take 100 mg by mouth daily. 07/16/20  Yes [provider]  lisinopril (ZESTRIL) 5 MG tablet Take 5 mg by mouth at bedtime. 07/07/20  Yes [provider]  omeprazole (PRILOSEC) 20 MG capsule Take 20 mg by mouth daily. 07/07/20  Yes [provider]  pravastatin (PRAVACHOL) 20 MG tablet Take 20 mg by mouth once a week. Sundays 07/07/20  Yes [provider]  TRESIBA FLEXTOUCH 100 UNIT/ML FlexTouch Pen Inject 20 Units into the skin at bedtime. 07/07/20  Yes [provider]     Vital Signs: BP (!) 133/94 (BP Location: Right Arm)   Pulse 79   Temp 98.5 F (36.9 C) (Oral)   Resp 19   Ht '5\' 11"'  (1.803 m)   Wt 205 lb 0.4 oz (93 kg)   SpO2 100%   BMI 28.60 kg/m   Physical Exam Constitutional:      General: He is not in acute distress. Cardiovascular:     Rate and Rhythm: Normal rate. Rhythm irregular.     Comments: Atrial flutter. Right femoral groin site is covered with gauze and  tegaderm. Site is soft and non-tender; dressing is clean and dry.  Pulmonary:     Effort: Pulmonary effort is normal.     Breath sounds: Normal breath sounds.  Abdominal:     General: Bowel sounds are normal.     Palpations: Abdomen is soft.  Musculoskeletal:     Right lower leg: No edema.     Left lower leg: No edema.  Skin:    General: Skin is warm and dry.  Neurological:     Mental Status: He is alert and oriented to person, place, and time.     Imaging: DG Chest 2 View  Result Date: 08/13/2020 CLINICAL DATA:  Chest pain.  Presyncope. EXAM: CHEST - 2 VIEW COMPARISON:  None. FINDINGS: The cardiomediastinal contours are normal. Subsegmental bibasilar atelectasis. Pulmonary vasculature is normal. No consolidation, pleural effusion, or pneumothorax. No acute osseous abnormalities are seen. IMPRESSION: Subsegmental bibasilar atelectasis. Electronically Signed   By: Keith Rake M.D.   On: 08/13/2020 16:26   CT ANGIO CHEST PE W OR WO CONTRAST  Result Date: 08/14/2020 CLINICAL DATA:  PE suspected, high probability EXAM: CT ANGIOGRAPHY CHEST WITH CONTRAST TECHNIQUE: Multidetector CT imaging of the chest was performed using the standard protocol during bolus administration of intravenous contrast. Multiplanar CT image reconstructions and MIPs were obtained to evaluate the vascular anatomy. CONTRAST:  150m OMNIPAQUE IOHEXOL 350 MG/ML SOLN COMPARISON:  Chest x-ray 10/11/2020 FINDINGS: Cardiovascular: Extensive bilateral pulmonary emboli, most pronounced in the left upper and left lower lobes. Saddle embolus noted. Elevated RV to LV ratio of 1.6. This is compatible with right heart strain. Heart is normal size. Mediastinum/Nodes: No mediastinal, hilar, or axillary adenopathy. Trachea and esophagus are unremarkable. Thyroid unremarkable. Lungs/Pleura: Lingular atelectasis or scarring. Few small scattered subpleural nodules in the right lower lobe measuring up to 4 mm. No confluent opacities or  effusions. Upper Abdomen: Imaging into the upper abdomen demonstrates no acute findings. Musculoskeletal: Chest wall soft tissues are unremarkable. No acute bony abnormality. Review of the MIP images confirms the above findings. IMPRESSION: Extensive bilateral pulmonary emboli including saddle embolus. CT evidence of right heart strain (RV/LV Ratio = 1.6) consistent with at least submassive (intermediate risk) PE. The presence of right heart strain has been associated with an increased risk of morbidity and mortality. Small scattered nodules in the right lower lobe, 4 mm or less. No follow-up needed if patient is low-risk (and has no known or suspected primary neoplasm). Non-contrast chest CT can be considered in 12 months if patient is high-risk. This recommendation follows the consensus statement: Guidelines for Management of Incidental Pulmonary Nodules Detected on CT Images: From the Fleischner Society 2017; Radiology 2017; 284:228-243. Critical Value/emergent results were called by telephone at the time of interpretation on 08/14/2020 at 9:57 pm to provider Dr. Koleen Distance, who verbally acknowledged these results. Electronically Signed   By: Rolm Baptise M.D.   On: 08/14/2020 22:19   IR Angiogram Pulmonary Bilateral Selective  Result Date: 08/15/2020 INDICATION: History of sub massive pulmonary embolism. Patient presents now for bilateral pulmonary artery catheter directed thrombolysis. EXAM: 1. ULTRASOUND GUIDANCE FOR VENOUS ACCESS X2 2. PULMONARY ARTERIOGRAPHY 3. FLUOROSCOPIC GUIDED PLACEMENT OF BILATERAL PULMONARY ARTERIAL LYTIC INFUSION CATHETERS COMPARISON:  Chest CTA-08/14/2020 MEDICATIONS: None ANESTHESIA/SEDATION: Moderate (conscious) sedation was employed during this procedure. A total of Versed 2 mg and Fentanyl 100 mcg was administered intravenously. Moderate Sedation Time: 56 minutes. The patient's level of consciousness and vital signs were monitored continuously by radiology nursing throughout the  procedure under my direct supervision. CONTRAST:  25 mL OMNIPAQUE IOHEXOL 300 MG/ML  SOLN FLUOROSCOPY TIME:  28 minutes, 48 seconds (720 mGy) COMPLICATIONS: None immediate. TECHNIQUE: Informed written consent was obtained from the patient after a discussion of the risks, benefits and alternatives to treatment. Questions regarding the procedure were encouraged and answered. A timeout was performed prior to the initiation of the procedure. Ultrasound scanning was performed of the right groin and demonstrated wide patency of the right common femoral vein. As such, the right common femoral vein was selected venous access. The right groin was prepped and draped in the usual sterile fashion, and a sterile drape was applied covering the operative field. Maximum barrier sterile technique with sterile gowns and gloves were used for the procedure. A timeout was performed prior to the initiation of the procedure. Local anesthesia was provided with 1% lidocaine. Under direct ultrasound guidance, the right common femoral vein was accessed with a micro puncture kit ultimately allowing placement of a 6 French vascular sheath. Slightly cranial to this initial access, the right common femoral was again accessed with an additional micropuncture kit ultimately allowing placement of an additional 6 French vascular sheath. Ultrasound images were saved for procedural documentation purposes. With the use of a glidewire, a vertebral catheter was advanced into the main pulmonary artery and a limited central pulmonary arteriogram was performed. Pressure measurements were then obtained from the  main pulmonary artery. With the use of an exchange length stiff glidewire, a vertebral catheter was advanced to the level of the right pulmonary artery and a selective right pulmonary arteriogram was performed. Next, under intermittent fluoroscopic guidance, the vertebral catheter was exchanged for a 90/15 cm multi side-hole infusion catheter. Again,  with the use of an exchange length stiff glidewire, vertebral catheter was advanced to the level of the left pulmonary artery and a selective left pulmonary arteriogram performed Next, under intermittent fluoroscopic guidance, the vertebral catheter was exchanged for a 90/10 cm multi side-hole infusion catheter. A postprocedural fluoroscopic image was obtained to document final catheter positioning. The external catheter tubing was secured at the right thigh and the lytic therapy was initiated. The patient tolerated the procedure well without immediate postprocedural complication. FINDINGS: Central pulmonary arteriogram was performed demonstrating appropriate positioning. There was great difficulty advancing the catheter through the right atrium and ventricle the level main artery secondary to tortuosity. Acquired pressure measurements: Main  pulmonary artery - 53/17; mean - 27 (normal: < 25/10) Following the procedure, the right infusion catheter tip overlies the mid medial aspect of the right lung while the left infusion catheter tip overlies the left hilum. The infusion catheters could not be advanced further secondary to marked tortuosity of the catheter tracts. IMPRESSION: 1. Successful fluoroscopic guided initiation of bilateral catheter directed pulmonary arterial lysis for sub massive pulmonary embolism and right-sided heart strain. 2. Elevated pressure measurements within the main pulmonary artery compatible with critical pulmonary arterial hypertension. PLAN: - Initiate 12 hour tPA infusion. - A.M. chest radiograph to document infusion catheter positioning. - Will perform bedside pressure measurements and catheter removal tomorrow morning. Electronically Signed   By: Sandi Mariscal M.D.   On: 08/15/2020 15:19   IR Angiogram Selective Each Additional Vessel  Result Date: 08/15/2020 INDICATION: History of sub massive pulmonary embolism. Patient presents now for bilateral pulmonary artery catheter directed  thrombolysis. EXAM: 1. ULTRASOUND GUIDANCE FOR VENOUS ACCESS X2 2. PULMONARY ARTERIOGRAPHY 3. FLUOROSCOPIC GUIDED PLACEMENT OF BILATERAL PULMONARY ARTERIAL LYTIC INFUSION CATHETERS COMPARISON:  Chest CTA-08/14/2020 MEDICATIONS: None ANESTHESIA/SEDATION: Moderate (conscious) sedation was employed during this procedure. A total of Versed 2 mg and Fentanyl 100 mcg was administered intravenously. Moderate Sedation Time: 56 minutes. The patient's level of consciousness and vital signs were monitored continuously by radiology nursing throughout the procedure under my direct supervision. CONTRAST:  25 mL OMNIPAQUE IOHEXOL 300 MG/ML  SOLN FLUOROSCOPY TIME:  28 minutes, 48 seconds (734 mGy) COMPLICATIONS: None immediate. TECHNIQUE: Informed written consent was obtained from the patient after a discussion of the risks, benefits and alternatives to treatment. Questions regarding the procedure were encouraged and answered. A timeout was performed prior to the initiation of the procedure. Ultrasound scanning was performed of the right groin and demonstrated wide patency of the right common femoral vein. As such, the right common femoral vein was selected venous access. The right groin was prepped and draped in the usual sterile fashion, and a sterile drape was applied covering the operative field. Maximum barrier sterile technique with sterile gowns and gloves were used for the procedure. A timeout was performed prior to the initiation of the procedure. Local anesthesia was provided with 1% lidocaine. Under direct ultrasound guidance, the right common femoral vein was accessed with a micro puncture kit ultimately allowing placement of a 6 French vascular sheath. Slightly cranial to this initial access, the right common femoral was again accessed with an additional micropuncture kit ultimately allowing placement of an additional  6 French vascular sheath. Ultrasound images were saved for procedural documentation purposes. With the  use of a glidewire, a vertebral catheter was advanced into the main pulmonary artery and a limited central pulmonary arteriogram was performed. Pressure measurements were then obtained from the main pulmonary artery. With the use of an exchange length stiff glidewire, a vertebral catheter was advanced to the level of the right pulmonary artery and a selective right pulmonary arteriogram was performed. Next, under intermittent fluoroscopic guidance, the vertebral catheter was exchanged for a 90/15 cm multi side-hole infusion catheter. Again, with the use of an exchange length stiff glidewire, vertebral catheter was advanced to the level of the left pulmonary artery and a selective left pulmonary arteriogram performed Next, under intermittent fluoroscopic guidance, the vertebral catheter was exchanged for a 90/10 cm multi side-hole infusion catheter. A postprocedural fluoroscopic image was obtained to document final catheter positioning. The external catheter tubing was secured at the right thigh and the lytic therapy was initiated. The patient tolerated the procedure well without immediate postprocedural complication. FINDINGS: Central pulmonary arteriogram was performed demonstrating appropriate positioning. There was great difficulty advancing the catheter through the right atrium and ventricle the level main artery secondary to tortuosity. Acquired pressure measurements: Main  pulmonary artery - 53/17; mean - 27 (normal: < 25/10) Following the procedure, the right infusion catheter tip overlies the mid medial aspect of the right lung while the left infusion catheter tip overlies the left hilum. The infusion catheters could not be advanced further secondary to marked tortuosity of the catheter tracts. IMPRESSION: 1. Successful fluoroscopic guided initiation of bilateral catheter directed pulmonary arterial lysis for sub massive pulmonary embolism and right-sided heart strain. 2. Elevated pressure measurements  within the main pulmonary artery compatible with critical pulmonary arterial hypertension. PLAN: - Initiate 12 hour tPA infusion. - A.M. chest radiograph to document infusion catheter positioning. - Will perform bedside pressure measurements and catheter removal tomorrow morning. Electronically Signed   By: Sandi Mariscal M.D.   On: 08/15/2020 15:19   IR Angiogram Selective Each Additional Vessel  Result Date: 08/15/2020 INDICATION: History of sub massive pulmonary embolism. Patient presents now for bilateral pulmonary artery catheter directed thrombolysis. EXAM: 1. ULTRASOUND GUIDANCE FOR VENOUS ACCESS X2 2. PULMONARY ARTERIOGRAPHY 3. FLUOROSCOPIC GUIDED PLACEMENT OF BILATERAL PULMONARY ARTERIAL LYTIC INFUSION CATHETERS COMPARISON:  Chest CTA-08/14/2020 MEDICATIONS: None ANESTHESIA/SEDATION: Moderate (conscious) sedation was employed during this procedure. A total of Versed 2 mg and Fentanyl 100 mcg was administered intravenously. Moderate Sedation Time: 56 minutes. The patient's level of consciousness and vital signs were monitored continuously by radiology nursing throughout the procedure under my direct supervision. CONTRAST:  25 mL OMNIPAQUE IOHEXOL 300 MG/ML  SOLN FLUOROSCOPY TIME:  28 minutes, 48 seconds (179 mGy) COMPLICATIONS: None immediate. TECHNIQUE: Informed written consent was obtained from the patient after a discussion of the risks, benefits and alternatives to treatment. Questions regarding the procedure were encouraged and answered. A timeout was performed prior to the initiation of the procedure. Ultrasound scanning was performed of the right groin and demonstrated wide patency of the right common femoral vein. As such, the right common femoral vein was selected venous access. The right groin was prepped and draped in the usual sterile fashion, and a sterile drape was applied covering the operative field. Maximum barrier sterile technique with sterile gowns and gloves were used for the procedure.  A timeout was performed prior to the initiation of the procedure. Local anesthesia was provided with 1% lidocaine. Under direct ultrasound guidance, the  right common femoral vein was accessed with a micro puncture kit ultimately allowing placement of a 6 French vascular sheath. Slightly cranial to this initial access, the right common femoral was again accessed with an additional micropuncture kit ultimately allowing placement of an additional 6 French vascular sheath. Ultrasound images were saved for procedural documentation purposes. With the use of a glidewire, a vertebral catheter was advanced into the main pulmonary artery and a limited central pulmonary arteriogram was performed. Pressure measurements were then obtained from the main pulmonary artery. With the use of an exchange length stiff glidewire, a vertebral catheter was advanced to the level of the right pulmonary artery and a selective right pulmonary arteriogram was performed. Next, under intermittent fluoroscopic guidance, the vertebral catheter was exchanged for a 90/15 cm multi side-hole infusion catheter. Again, with the use of an exchange length stiff glidewire, vertebral catheter was advanced to the level of the left pulmonary artery and a selective left pulmonary arteriogram performed Next, under intermittent fluoroscopic guidance, the vertebral catheter was exchanged for a 90/10 cm multi side-hole infusion catheter. A postprocedural fluoroscopic image was obtained to document final catheter positioning. The external catheter tubing was secured at the right thigh and the lytic therapy was initiated. The patient tolerated the procedure well without immediate postprocedural complication. FINDINGS: Central pulmonary arteriogram was performed demonstrating appropriate positioning. There was great difficulty advancing the catheter through the right atrium and ventricle the level main artery secondary to tortuosity. Acquired pressure measurements:  Main  pulmonary artery - 53/17; mean - 27 (normal: < 25/10) Following the procedure, the right infusion catheter tip overlies the mid medial aspect of the right lung while the left infusion catheter tip overlies the left hilum. The infusion catheters could not be advanced further secondary to marked tortuosity of the catheter tracts. IMPRESSION: 1. Successful fluoroscopic guided initiation of bilateral catheter directed pulmonary arterial lysis for sub massive pulmonary embolism and right-sided heart strain. 2. Elevated pressure measurements within the main pulmonary artery compatible with critical pulmonary arterial hypertension. PLAN: - Initiate 12 hour tPA infusion. - A.M. chest radiograph to document infusion catheter positioning. - Will perform bedside pressure measurements and catheter removal tomorrow morning. Electronically Signed   By: Sandi Mariscal M.D.   On: 08/15/2020 15:19   DG Chest Port 1 View  Result Date: 08/16/2020 CLINICAL DATA:  70 year old male with bilateral/sub massive pulmonary embolus. Status post pulmonary artery catheter directed thrombo lysis yesterday. EXAM: PORTABLE CHEST 1 VIEW COMPARISON:  CTA chest 08/14/2020.  Chest radiographs 08/13/2020. FINDINGS: Portable AP semi upright view at 0437 hours. Inferior approach pulmonary artery lysis catheters extend toward both hila, that on the right is somewhat more peripheral. Larger, improved lung volumes compared to 08/13/2020. Stable mediastinal contours. Allowing for portable technique the lungs are clear. No pneumothorax. No acute osseous abnormality identified. Paucity of bowel gas in the upper abdomen. IMPRESSION: 1. Bilateral pulmonary artery lysis catheters extend to the hila, the right somewhat more peripheral. 2.  No new cardiopulmonary abnormality. Electronically Signed   By: Genevie Ann M.D.   On: 08/16/2020 08:17   ECHOCARDIOGRAM LIMITED  Result Date: 08/14/2020    ECHOCARDIOGRAM LIMITED REPORT   Patient Name:   JARIS KOHLES Date  of Exam: 08/14/2020 Medical Rec #:  [id number]     Height:       71.0 in Accession #:    [id number]    Weight:       200.0 lb Date of Birth:  [birth year]  BSA:          2.109 m Patient Age:    70 years      BP:           116/83 mmHg Patient Gender: M             HR:           74 bpm. Exam Location:  Inpatient Procedure: Limited Echo, Cardiac Doppler, Limited Color Doppler and Intracardiac            Opacification Agent Indications:    Atrial Fibrillation I48.91  History:        Patient has no prior history of Echocardiogram examinations.                 Risk Factors:Hypertension, Diabetes and Dyslipidemia. COVID 19.  Sonographer:    Darlina Sicilian RDCS Referring Phys: [id number] [place]  1. Left ventricular ejection fraction, by estimation, is 65 to 70%. The left ventricle has normal function. Left ventricular diastolic parameters were normal.  2. Right ventricular systolic function is moderately reduced. The right ventricular size is mildly enlarged.  3. The aortic valve is grossly normal. Mild aortic valve sclerosis is present, with no evidence of aortic valve stenosis. FINDINGS  Left Ventricle: Left ventricular ejection fraction, by estimation, is 65 to 70%. The left ventricle has normal function. Definity contrast agent was given IV to delineate the left ventricular endocardial borders. Left ventricular diastolic parameters were normal. Right Ventricle: The right ventricular size is mildly enlarged. Right ventricular systolic function is moderately reduced. Mitral Valve: Mild mitral annular calcification. Tricuspid Valve: Tricuspid valve regurgitation is trivial. Aortic Valve: The aortic valve is grossly normal. Mild aortic valve sclerosis is present, with no evidence of aortic valve stenosis. Aortic valve mean gradient measures 7.0 mmHg. Aortic valve peak gradient measures 12.8 mmHg. Aortic valve area, by VTI measures 1.58 cm. LEFT VENTRICLE PLAX 2D LVIDd:         4.10 cm LVIDs:         3.50  cm LV PW:         0.90 cm LV IVS:        0.90 cm LVOT diam:     2.00 cm LV SV:         41 LV SV Index:   19 LVOT Area:     3.14 cm  LV Volumes (MOD) LV vol d, MOD A2C: 61.1 ml LV vol d, MOD A4C: 41.4 ml LV vol s, MOD A2C: 25.7 ml LV vol s, MOD A4C: 12.8 ml LV SV MOD A2C:     35.4 ml LV SV MOD A4C:     41.4 ml LV SV MOD BP:      37.8 ml AORTIC VALVE AV Area (Vmax):    1.72 cm AV Area (Vmean):   1.62 cm AV Area (VTI):     1.58 cm AV Vmax:           179.00 cm/s AV Vmean:          117.000 cm/s AV VTI:            0.258 m AV Peak Grad:      12.8 mmHg AV Mean Grad:      7.0 mmHg LVOT Vmax:         97.90 cm/s LVOT Vmean:        60.500 cm/s LVOT VTI:          0.130 m LVOT/AV VTI ratio: 0.50  MITRAL VALVE MV Area (PHT): 3.39 cm    SHUNTS MV Decel Time: 224 msec    Systemic VTI:  0.13 m MV E velocity: 72.87 cm/s  Systemic Diam: 2.00 cm Candee Furbish MD Electronically signed by Candee Furbish MD Signature Date/Time: 08/14/2020/2:17:06 PM    Final    IR INFUSION THROMBOL ARTERIAL INITIAL (MS)  Result Date: 08/15/2020 INDICATION: History of sub massive pulmonary embolism. Patient presents now for bilateral pulmonary artery catheter directed thrombolysis. EXAM: 1. ULTRASOUND GUIDANCE FOR VENOUS ACCESS X2 2. PULMONARY ARTERIOGRAPHY 3. FLUOROSCOPIC GUIDED PLACEMENT OF BILATERAL PULMONARY ARTERIAL LYTIC INFUSION CATHETERS COMPARISON:  Chest CTA-08/14/2020 MEDICATIONS: None ANESTHESIA/SEDATION: Moderate (conscious) sedation was employed during this procedure. A total of Versed 2 mg and Fentanyl 100 mcg was administered intravenously. Moderate Sedation Time: 56 minutes. The patient's level of consciousness and vital signs were monitored continuously by radiology nursing throughout the procedure under my direct supervision. CONTRAST:  25 mL OMNIPAQUE IOHEXOL 300 MG/ML  SOLN FLUOROSCOPY TIME:  28 minutes, 48 seconds (016 mGy) COMPLICATIONS: None immediate. TECHNIQUE: Informed written consent was obtained from the patient after a  discussion of the risks, benefits and alternatives to treatment. Questions regarding the procedure were encouraged and answered. A timeout was performed prior to the initiation of the procedure. Ultrasound scanning was performed of the right groin and demonstrated wide patency of the right common femoral vein. As such, the right common femoral vein was selected venous access. The right groin was prepped and draped in the usual sterile fashion, and a sterile drape was applied covering the operative field. Maximum barrier sterile technique with sterile gowns and gloves were used for the procedure. A timeout was performed prior to the initiation of the procedure. Local anesthesia was provided with 1% lidocaine. Under direct ultrasound guidance, the right common femoral vein was accessed with a micro puncture kit ultimately allowing placement of a 6 French vascular sheath. Slightly cranial to this initial access, the right common femoral was again accessed with an additional micropuncture kit ultimately allowing placement of an additional 6 French vascular sheath. Ultrasound images were saved for procedural documentation purposes. With the use of a glidewire, a vertebral catheter was advanced into the main pulmonary artery and a limited central pulmonary arteriogram was performed. Pressure measurements were then obtained from the main pulmonary artery. With the use of an exchange length stiff glidewire, a vertebral catheter was advanced to the level of the right pulmonary artery and a selective right pulmonary arteriogram was performed. Next, under intermittent fluoroscopic guidance, the vertebral catheter was exchanged for a 90/15 cm multi side-hole infusion catheter. Again, with the use of an exchange length stiff glidewire, vertebral catheter was advanced to the level of the left pulmonary artery and a selective left pulmonary arteriogram performed Next, under intermittent fluoroscopic guidance, the vertebral  catheter was exchanged for a 90/10 cm multi side-hole infusion catheter. A postprocedural fluoroscopic image was obtained to document final catheter positioning. The external catheter tubing was secured at the right thigh and the lytic therapy was initiated. The patient tolerated the procedure well without immediate postprocedural complication. FINDINGS: Central pulmonary arteriogram was performed demonstrating appropriate positioning. There was great difficulty advancing the catheter through the right atrium and ventricle the level main artery secondary to tortuosity. Acquired pressure measurements: Main  pulmonary artery - 53/17; mean - 27 (normal: < 25/10) Following the procedure, the right infusion catheter tip overlies the mid medial aspect of the right lung while the left infusion catheter tip overlies the left  hilum. The infusion catheters could not be advanced further secondary to marked tortuosity of the catheter tracts. IMPRESSION: 1. Successful fluoroscopic guided initiation of bilateral catheter directed pulmonary arterial lysis for sub massive pulmonary embolism and right-sided heart strain. 2. Elevated pressure measurements within the main pulmonary artery compatible with critical pulmonary arterial hypertension. PLAN: - Initiate 12 hour tPA infusion. - A.M. chest radiograph to document infusion catheter positioning. - Will perform bedside pressure measurements and catheter removal tomorrow morning. Electronically Signed   By: Sandi Mariscal M.D.   On: 08/15/2020 15:19   IR INFUSION THROMBOL ARTERIAL INITIAL (MS)  Result Date: 08/15/2020 INDICATION: History of sub massive pulmonary embolism. Patient presents now for bilateral pulmonary artery catheter directed thrombolysis. EXAM: 1. ULTRASOUND GUIDANCE FOR VENOUS ACCESS X2 2. PULMONARY ARTERIOGRAPHY 3. FLUOROSCOPIC GUIDED PLACEMENT OF BILATERAL PULMONARY ARTERIAL LYTIC INFUSION CATHETERS COMPARISON:  Chest CTA-08/14/2020 MEDICATIONS: None  ANESTHESIA/SEDATION: Moderate (conscious) sedation was employed during this procedure. A total of Versed 2 mg and Fentanyl 100 mcg was administered intravenously. Moderate Sedation Time: 56 minutes. The patient's level of consciousness and vital signs were monitored continuously by radiology nursing throughout the procedure under my direct supervision. CONTRAST:  25 mL OMNIPAQUE IOHEXOL 300 MG/ML  SOLN FLUOROSCOPY TIME:  28 minutes, 48 seconds (543 mGy) COMPLICATIONS: None immediate. TECHNIQUE: Informed written consent was obtained from the patient after a discussion of the risks, benefits and alternatives to treatment. Questions regarding the procedure were encouraged and answered. A timeout was performed prior to the initiation of the procedure. Ultrasound scanning was performed of the right groin and demonstrated wide patency of the right common femoral vein. As such, the right common femoral vein was selected venous access. The right groin was prepped and draped in the usual sterile fashion, and a sterile drape was applied covering the operative field. Maximum barrier sterile technique with sterile gowns and gloves were used for the procedure. A timeout was performed prior to the initiation of the procedure. Local anesthesia was provided with 1% lidocaine. Under direct ultrasound guidance, the right common femoral vein was accessed with a micro puncture kit ultimately allowing placement of a 6 French vascular sheath. Slightly cranial to this initial access, the right common femoral was again accessed with an additional micropuncture kit ultimately allowing placement of an additional 6 French vascular sheath. Ultrasound images were saved for procedural documentation purposes. With the use of a glidewire, a vertebral catheter was advanced into the main pulmonary artery and a limited central pulmonary arteriogram was performed. Pressure measurements were then obtained from the main pulmonary artery. With the use of  an exchange length stiff glidewire, a vertebral catheter was advanced to the level of the right pulmonary artery and a selective right pulmonary arteriogram was performed. Next, under intermittent fluoroscopic guidance, the vertebral catheter was exchanged for a 90/15 cm multi side-hole infusion catheter. Again, with the use of an exchange length stiff glidewire, vertebral catheter was advanced to the level of the left pulmonary artery and a selective left pulmonary arteriogram performed Next, under intermittent fluoroscopic guidance, the vertebral catheter was exchanged for a 90/10 cm multi side-hole infusion catheter. A postprocedural fluoroscopic image was obtained to document final catheter positioning. The external catheter tubing was secured at the right thigh and the lytic therapy was initiated. The patient tolerated the procedure well without immediate postprocedural complication. FINDINGS: Central pulmonary arteriogram was performed demonstrating appropriate positioning. There was great difficulty advancing the catheter through the right atrium and ventricle the level main artery secondary  to tortuosity. Acquired pressure measurements: Main  pulmonary artery - 53/17; mean - 27 (normal: < 25/10) Following the procedure, the right infusion catheter tip overlies the mid medial aspect of the right lung while the left infusion catheter tip overlies the left hilum. The infusion catheters could not be advanced further secondary to marked tortuosity of the catheter tracts. IMPRESSION: 1. Successful fluoroscopic guided initiation of bilateral catheter directed pulmonary arterial lysis for sub massive pulmonary embolism and right-sided heart strain. 2. Elevated pressure measurements within the main pulmonary artery compatible with critical pulmonary arterial hypertension. PLAN: - Initiate 12 hour tPA infusion. - A.M. chest radiograph to document infusion catheter positioning. - Will perform bedside pressure  measurements and catheter removal tomorrow morning. Electronically Signed   By: Sandi Mariscal M.D.   On: 08/15/2020 15:19   IR THROMB F/U EVAL ART/VEN FINAL DAY (MS)  Result Date: 08/16/2020 INDICATION: History of sub massive pulmonary embolism, post initiation of bilateral pulmonary arterial thrombolysis on 08/15/2020. Patient has completed prescribed course of bilateral pulmonary arterial lytic infusion and presents today for repeat pulmonary arterial pressure measurements prior to infusion catheter removal. Patient states he is subjectively improved since undergoing the lytic infusion with decreased work of breathing. EXAM: PULMONARY ARTERIAL PRESSURE MEASUREMENT AND INFUSION CATHETER(S) REMOVAL COMPARISON:  Initiation of bilateral pulmonary arterial catheter directed thrombolysis-08/15/2020 Chest CTA-08/14/2020 Chest Radiograph - earlier same day MEDICATIONS: None CONTRAST:  None FLUOROSCOPY TIME:  None COMPLICATIONS: None immediate. TECHNIQUE: The patient was positioned supine on his hospital bed. Review of this morning's chest radiograph demonstrates unchanged positioning of bilateral pulmonary arterial infusion catheters with tip of the right-sided pulmonary arterial infusion catheter approximately 9 cm peripheral to the expected outflow of the main pulmonary artery. As such, the right pulmonary arterial catheter's infusion wire was removed and the multi side-hole infusion catheter was retracted to the estimated location of the main pulmonary artery. Pressure measurements were acquired from this location. At this point, the procedure was terminated. All wires, catheters and sheaths were removed from the patient. Hemostasis was achieved at the right groin access site with manual compression. A dressing was placed. The patient tolerated the procedure well without immediate postprocedural complication. FINDINGS: Acquired pressure measurements as follows: Preprocedural main pulmonary artery-53/17; mean - 27  (normal: < 25/10) Postprocedural main pulmonary artery-48/22; mean - 31 IMPRESSION: Technically successful completion of bilateral catheter directed pulmonary arterial thrombolysis with subjective improvement in work of breathing and slight reduction in peak systolic pressure within the main pulmonary artery. Electronically Signed   By: Sandi Mariscal M.D.   On: 08/16/2020 09:56    Labs:  CBC: Recent Labs    08/15/20 1828 08/16/20 0210 08/16/20 0430 08/16/20 1103  WBC 9.5 10.2 10.1 9.6  HGB 15.5 14.2 13.9 14.5  HCT 47.3 43.5 41.8 45.3  PLT 144* 125* 129* 121*    COAGS: No results for input(s): INR, APTT in the last 8760 hours.  BMP: Recent Labs    08/13/20 1549 08/14/20 0208 08/15/20 0205 08/16/20 0430  NA 138 138 136 136  K 3.9 4.2 3.9 4.1  CL 101 106 105 104  CO2 24 20* 22 21*  GLUCOSE 159* 226* 112* 96  BUN 26* 30* 21 14  CALCIUM 9.5 8.9 8.3* 8.3*  CREATININE 1.74* 1.57* 1.36* 1.31*  GFRNONAA 42* 47* 56* 59*    LIVER FUNCTION TESTS: No results for input(s): BILITOT, AST, ALT, ALKPHOS, PROT, ALBUMIN in the last 8760 hours.  Assessment and Plan:  Saddle pulmonary embolus s/p catheter-directed  thrombolysis 08/15/20: Right femoral infusion catheters removed at 0930 without incident. Slight reduction in peak systolic pressure within the main pulmonary artery. Patient states he feels better and has decreased work of breathing. Continue bed rest until 1400. Ok to remove right femoral dressing in 24 hours. Other plans per primary teams.   Please call IR with any concerns.  Electronically Signed: Soyla Dryer, AGACNP-BC [phone] 08/16/2020, 12:42 PM   I spent a total of 15 Minutes at the the patient's bedside AND on the patient's hospital floor or unit, greater than 50% of which was counseling/coordinating care for catheter-directed thrombolysis of submassive PE

## 2020-08-16 NOTE — Care Management (Signed)
Benefit check submitted for Eliquis. Results will be posted Monday. If patient DC's over the weekend, please send home with manufacturer 30 day free supply card.

## 2020-08-16 NOTE — Progress Notes (Signed)
Progress Note  Patient Name: Bobby Fields Date of Encounter: 08/16/2020  Primary Cardiologist: Werner Lean, MD   Subjective   Patient transferred to [facility] yesterday for catheter directed thrombolytics. Today doing well.  Inpatient Medications    Scheduled Meds: . Chlorhexidine Gluconate Cloth  6 each Topical Daily  . ezetimibe  10 mg Oral Daily  . insulin aspart  0-15 Units Subcutaneous TID WC  . insulin aspart  0-5 Units Subcutaneous QHS  . insulin glargine  15 Units Subcutaneous QHS  . pantoprazole  40 mg Oral Daily  . pravastatin  20 mg Oral Q Sun  . sodium chloride flush  3 mL Intravenous Q12H  . sodium chloride flush  3 mL Intravenous Q12H   Continuous Infusions: . sodium chloride 20 mL/hr at 08/16/20 0647  . sodium chloride    . sodium chloride    . sodium chloride 20 mL/hr at 08/16/20 0647  . sodium chloride 20.8 mL/hr at 08/16/20 0647  . sodium chloride 20.8 mL/hr at 08/16/20 0647  . heparin 1,500 Units/hr (08/16/20 0647)   PRN Meds: sodium chloride, sodium chloride, acetaminophen, ondansetron (ZOFRAN) IV, sodium chloride flush, sodium chloride flush   Vital Signs    Vitals:   08/16/20 0408 08/16/20 0500 08/16/20 0600 08/16/20 0751  BP:  127/83 113/82   Pulse:  78 78   Resp:  (!) 22 19   Temp: 97.8 F (36.6 C)   98.9 F (37.2 C)  TempSrc: Oral     SpO2:  95% 93%   Weight:   93 kg   Height:        Intake/Output Summary (Last 24 hours) at 08/16/2020 0755 Last data filed at 08/16/2020 0647 Gross per 24 hour  Intake 1688.35 ml  Output 1420 ml  Net 268.35 ml   Filed Weights   08/14/20 1716 08/15/20 0633 08/16/20 0600  Weight: 91.7 kg 91.8 kg 93 kg    Telemetry    Atrial flutter- Personally Reviewed  ECG    No new- Personally Reviewed  Physical Exam   GEN: No acute distress.   Neck: No JVD Cardiac: RRR, no murmurs, rubs, or gallops.  Respiratory: Clear to auscultation bilaterally. GI: Soft, nontender, non-distended  MS: No edema;  No deformity. Neuro:  Nonfocal  Psych: Normal affect   Labs    Chemistry Recent Labs  Lab 08/14/20 0208 08/15/20 0205 08/16/20 0430  NA 138 136 136  K 4.2 3.9 4.1  CL 106 105 104  CO2 20* 22 21*  GLUCOSE 226* 112* 96  BUN 30* 21 14  CREATININE 1.57* 1.36* 1.31*  CALCIUM 8.9 8.3* 8.3*  GFRNONAA 47* 56* 59*  ANIONGAP '12 9 11     ' Hematology Recent Labs  Lab 08/15/20 1828 08/16/20 0210 08/16/20 0430  WBC 9.5 10.2 10.1  RBC 5.48 5.07 4.91  HGB 15.5 14.2 13.9  HCT 47.3 43.5 41.8  MCV 86.3 85.8 85.1  MCH 28.3 28.0 28.3  MCHC 32.8 32.6 33.3  RDW 13.5 13.5 13.6  PLT 144* 125* 129*    Cardiac EnzymesNo results for input(s): TROPONINI in the last 168 hours. No results for input(s): TROPIPOC in the last 168 hours.   BNPNo results for input(s): BNP, PROBNP in the last 168 hours.   DDimer  Recent Labs  Lab 08/14/20 1640  DDIMER 14.27*     Radiology    CT ANGIO CHEST PE W OR WO CONTRAST  Result Date: 08/14/2020 CLINICAL DATA:  PE suspected, high probability EXAM: CT  ANGIOGRAPHY CHEST WITH CONTRAST TECHNIQUE: Multidetector CT imaging of the chest was performed using the standard protocol during bolus administration of intravenous contrast. Multiplanar CT image reconstructions and MIPs were obtained to evaluate the vascular anatomy. CONTRAST:  15m OMNIPAQUE IOHEXOL 350 MG/ML SOLN COMPARISON:  Chest x-ray 10/11/2020 FINDINGS: Cardiovascular: Extensive bilateral pulmonary emboli, most pronounced in the left upper and left lower lobes. Saddle embolus noted. Elevated RV to LV ratio of 1.6. This is compatible with right heart strain. Heart is normal size. Mediastinum/Nodes: No mediastinal, hilar, or axillary adenopathy. Trachea and esophagus are unremarkable. Thyroid unremarkable. Lungs/Pleura: Lingular atelectasis or scarring. Few small scattered subpleural nodules in the right lower lobe measuring up to 4 mm. No confluent opacities or effusions. Upper Abdomen: Imaging into the  upper abdomen demonstrates no acute findings. Musculoskeletal: Chest wall soft tissues are unremarkable. No acute bony abnormality. Review of the MIP images confirms the above findings. IMPRESSION: Extensive bilateral pulmonary emboli including saddle embolus. CT evidence of right heart strain (RV/LV Ratio = 1.6) consistent with at least submassive (intermediate risk) PE. The presence of right heart strain has been associated with an increased risk of morbidity and mortality. Small scattered nodules in the right lower lobe, 4 mm or less. No follow-up needed if patient is low-risk (and has no known or suspected primary neoplasm). Non-contrast chest CT can be considered in 12 months if patient is high-risk. This recommendation follows the consensus statement: Guidelines for Management of Incidental Pulmonary Nodules Detected on CT Images: From the Fleischner Society 2017; Radiology 2017; 284:228-243. Critical Value/emergent results were called by telephone at the time of interpretation on 08/14/2020 at 9:57 pm to provider Dr. BKoleen Distance who verbally acknowledged these results. Electronically Signed   By: KRolm BaptiseM.D.   On: 08/14/2020 22:19   IR Angiogram Pulmonary Bilateral Selective  Result Date: 08/15/2020 INDICATION: History of sub massive pulmonary embolism. Patient presents now for bilateral pulmonary artery catheter directed thrombolysis. EXAM: 1. ULTRASOUND GUIDANCE FOR VENOUS ACCESS X2 2. PULMONARY ARTERIOGRAPHY 3. FLUOROSCOPIC GUIDED PLACEMENT OF BILATERAL PULMONARY ARTERIAL LYTIC INFUSION CATHETERS COMPARISON:  Chest CTA-08/14/2020 MEDICATIONS: None ANESTHESIA/SEDATION: Moderate (conscious) sedation was employed during this procedure. A total of Versed 2 mg and Fentanyl 100 mcg was administered intravenously. Moderate Sedation Time: 56 minutes. The patient's level of consciousness and vital signs were monitored continuously by radiology nursing throughout the procedure under my direct supervision.  CONTRAST:  25 mL OMNIPAQUE IOHEXOL 300 MG/ML  SOLN FLUOROSCOPY TIME:  28 minutes, 48 seconds (2703mGy) COMPLICATIONS: None immediate. TECHNIQUE: Informed written consent was obtained from the patient after a discussion of the risks, benefits and alternatives to treatment. Questions regarding the procedure were encouraged and answered. A timeout was performed prior to the initiation of the procedure. Ultrasound scanning was performed of the right groin and demonstrated wide patency of the right common femoral vein. As such, the right common femoral vein was selected venous access. The right groin was prepped and draped in the usual sterile fashion, and a sterile drape was applied covering the operative field. Maximum barrier sterile technique with sterile gowns and gloves were used for the procedure. A timeout was performed prior to the initiation of the procedure. Local anesthesia was provided with 1% lidocaine. Under direct ultrasound guidance, the right common femoral vein was accessed with a micro puncture kit ultimately allowing placement of a 6 French vascular sheath. Slightly cranial to this initial access, the right common femoral was again accessed with an additional micropuncture kit ultimately allowing placement  of an additional 6 [place] vascular sheath. Ultrasound images were saved for procedural documentation purposes. With the use of a glidewire, a vertebral catheter was advanced into the main pulmonary artery and a limited central pulmonary arteriogram was performed. Pressure measurements were then obtained from the main pulmonary artery. With the use of an exchange length stiff glidewire, a vertebral catheter was advanced to the level of the right pulmonary artery and a selective right pulmonary arteriogram was performed. Next, under intermittent fluoroscopic guidance, the vertebral catheter was exchanged for a 90/15 cm multi side-hole infusion catheter. Again, with the use of an exchange length stiff  glidewire, vertebral catheter was advanced to the level of the left pulmonary artery and a selective left pulmonary arteriogram performed Next, under intermittent fluoroscopic guidance, the vertebral catheter was exchanged for a 90/10 cm multi side-hole infusion catheter. A postprocedural fluoroscopic image was obtained to document final catheter positioning. The external catheter tubing was secured at the right thigh and the lytic therapy was initiated. The patient tolerated the procedure well without immediate postprocedural complication. FINDINGS: Central pulmonary arteriogram was performed demonstrating appropriate positioning. There was great difficulty advancing the catheter through the right atrium and ventricle the level main artery secondary to tortuosity. Acquired pressure measurements: Main  pulmonary artery - 53/17; mean - 27 (normal: < 25/10) Following the procedure, the right infusion catheter tip overlies the mid medial aspect of the right lung while the left infusion catheter tip overlies the left hilum. The infusion catheters could not be advanced further secondary to marked tortuosity of the catheter tracts. IMPRESSION: 1. Successful fluoroscopic guided initiation of bilateral catheter directed pulmonary arterial lysis for sub massive pulmonary embolism and right-sided heart strain. 2. Elevated pressure measurements within the main pulmonary artery compatible with critical pulmonary arterial hypertension. PLAN: - Initiate 12 hour tPA infusion. - A.M. chest radiograph to document infusion catheter positioning. - Will perform bedside pressure measurements and catheter removal tomorrow morning. Electronically Signed   By: Sandi Mariscal M.D.   On: 08/15/2020 15:19   IR Angiogram Selective Each Additional Vessel  Result Date: 08/15/2020 INDICATION: History of sub massive pulmonary embolism. Patient presents now for bilateral pulmonary artery catheter directed thrombolysis. EXAM: 1. ULTRASOUND GUIDANCE  FOR VENOUS ACCESS X2 2. PULMONARY ARTERIOGRAPHY 3. FLUOROSCOPIC GUIDED PLACEMENT OF BILATERAL PULMONARY ARTERIAL LYTIC INFUSION CATHETERS COMPARISON:  Chest CTA-08/14/2020 MEDICATIONS: None ANESTHESIA/SEDATION: Moderate (conscious) sedation was employed during this procedure. A total of Versed 2 mg and Fentanyl 100 mcg was administered intravenously. Moderate Sedation Time: 56 minutes. The patient's level of consciousness and vital signs were monitored continuously by radiology nursing throughout the procedure under my direct supervision. CONTRAST:  25 mL OMNIPAQUE IOHEXOL 300 MG/ML  SOLN FLUOROSCOPY TIME:  28 minutes, 48 seconds (875 mGy) COMPLICATIONS: None immediate. TECHNIQUE: Informed written consent was obtained from the patient after a discussion of the risks, benefits and alternatives to treatment. Questions regarding the procedure were encouraged and answered. A timeout was performed prior to the initiation of the procedure. Ultrasound scanning was performed of the right groin and demonstrated wide patency of the right common femoral vein. As such, the right common femoral vein was selected venous access. The right groin was prepped and draped in the usual sterile fashion, and a sterile drape was applied covering the operative field. Maximum barrier sterile technique with sterile gowns and gloves were used for the procedure. A timeout was performed prior to the initiation of the procedure. Local anesthesia was provided with 1% lidocaine. Under direct ultrasound  guidance, the right common femoral vein was accessed with a micro puncture kit ultimately allowing placement of a 6 French vascular sheath. Slightly cranial to this initial access, the right common femoral was again accessed with an additional micropuncture kit ultimately allowing placement of an additional 6 French vascular sheath. Ultrasound images were saved for procedural documentation purposes. With the use of a glidewire, a vertebral catheter  was advanced into the main pulmonary artery and a limited central pulmonary arteriogram was performed. Pressure measurements were then obtained from the main pulmonary artery. With the use of an exchange length stiff glidewire, a vertebral catheter was advanced to the level of the right pulmonary artery and a selective right pulmonary arteriogram was performed. Next, under intermittent fluoroscopic guidance, the vertebral catheter was exchanged for a 90/15 cm multi side-hole infusion catheter. Again, with the use of an exchange length stiff glidewire, vertebral catheter was advanced to the level of the left pulmonary artery and a selective left pulmonary arteriogram performed Next, under intermittent fluoroscopic guidance, the vertebral catheter was exchanged for a 90/10 cm multi side-hole infusion catheter. A postprocedural fluoroscopic image was obtained to document final catheter positioning. The external catheter tubing was secured at the right thigh and the lytic therapy was initiated. The patient tolerated the procedure well without immediate postprocedural complication. FINDINGS: Central pulmonary arteriogram was performed demonstrating appropriate positioning. There was great difficulty advancing the catheter through the right atrium and ventricle the level main artery secondary to tortuosity. Acquired pressure measurements: Main  pulmonary artery - 53/17; mean - 27 (normal: < 25/10) Following the procedure, the right infusion catheter tip overlies the mid medial aspect of the right lung while the left infusion catheter tip overlies the left hilum. The infusion catheters could not be advanced further secondary to marked tortuosity of the catheter tracts. IMPRESSION: 1. Successful fluoroscopic guided initiation of bilateral catheter directed pulmonary arterial lysis for sub massive pulmonary embolism and right-sided heart strain. 2. Elevated pressure measurements within the main pulmonary artery compatible  with critical pulmonary arterial hypertension. PLAN: - Initiate 12 hour tPA infusion. - A.M. chest radiograph to document infusion catheter positioning. - Will perform bedside pressure measurements and catheter removal tomorrow morning. Electronically Signed   By: Sandi Mariscal M.D.   On: 08/15/2020 15:19   IR Angiogram Selective Each Additional Vessel  Result Date: 08/15/2020 INDICATION: History of sub massive pulmonary embolism. Patient presents now for bilateral pulmonary artery catheter directed thrombolysis. EXAM: 1. ULTRASOUND GUIDANCE FOR VENOUS ACCESS X2 2. PULMONARY ARTERIOGRAPHY 3. FLUOROSCOPIC GUIDED PLACEMENT OF BILATERAL PULMONARY ARTERIAL LYTIC INFUSION CATHETERS COMPARISON:  Chest CTA-08/14/2020 MEDICATIONS: None ANESTHESIA/SEDATION: Moderate (conscious) sedation was employed during this procedure. A total of Versed 2 mg and Fentanyl 100 mcg was administered intravenously. Moderate Sedation Time: 56 minutes. The patient's level of consciousness and vital signs were monitored continuously by radiology nursing throughout the procedure under my direct supervision. CONTRAST:  25 mL OMNIPAQUE IOHEXOL 300 MG/ML  SOLN FLUOROSCOPY TIME:  28 minutes, 48 seconds (094 mGy) COMPLICATIONS: None immediate. TECHNIQUE: Informed written consent was obtained from the patient after a discussion of the risks, benefits and alternatives to treatment. Questions regarding the procedure were encouraged and answered. A timeout was performed prior to the initiation of the procedure. Ultrasound scanning was performed of the right groin and demonstrated wide patency of the right common femoral vein. As such, the right common femoral vein was selected venous access. The right groin was prepped and draped in the usual sterile fashion, and a  sterile drape was applied covering the operative field. Maximum barrier sterile technique with sterile gowns and gloves were used for the procedure. A timeout was performed prior to the  initiation of the procedure. Local anesthesia was provided with 1% lidocaine. Under direct ultrasound guidance, the right common femoral vein was accessed with a micro puncture kit ultimately allowing placement of a 6 French vascular sheath. Slightly cranial to this initial access, the right common femoral was again accessed with an additional micropuncture kit ultimately allowing placement of an additional 6 French vascular sheath. Ultrasound images were saved for procedural documentation purposes. With the use of a glidewire, a vertebral catheter was advanced into the main pulmonary artery and a limited central pulmonary arteriogram was performed. Pressure measurements were then obtained from the main pulmonary artery. With the use of an exchange length stiff glidewire, a vertebral catheter was advanced to the level of the right pulmonary artery and a selective right pulmonary arteriogram was performed. Next, under intermittent fluoroscopic guidance, the vertebral catheter was exchanged for a 90/15 cm multi side-hole infusion catheter. Again, with the use of an exchange length stiff glidewire, vertebral catheter was advanced to the level of the left pulmonary artery and a selective left pulmonary arteriogram performed Next, under intermittent fluoroscopic guidance, the vertebral catheter was exchanged for a 90/10 cm multi side-hole infusion catheter. A postprocedural fluoroscopic image was obtained to document final catheter positioning. The external catheter tubing was secured at the right thigh and the lytic therapy was initiated. The patient tolerated the procedure well without immediate postprocedural complication. FINDINGS: Central pulmonary arteriogram was performed demonstrating appropriate positioning. There was great difficulty advancing the catheter through the right atrium and ventricle the level main artery secondary to tortuosity. Acquired pressure measurements: Main  pulmonary artery - 53/17; mean -  27 (normal: < 25/10) Following the procedure, the right infusion catheter tip overlies the mid medial aspect of the right lung while the left infusion catheter tip overlies the left hilum. The infusion catheters could not be advanced further secondary to marked tortuosity of the catheter tracts. IMPRESSION: 1. Successful fluoroscopic guided initiation of bilateral catheter directed pulmonary arterial lysis for sub massive pulmonary embolism and right-sided heart strain. 2. Elevated pressure measurements within the main pulmonary artery compatible with critical pulmonary arterial hypertension. PLAN: - Initiate 12 hour tPA infusion. - A.M. chest radiograph to document infusion catheter positioning. - Will perform bedside pressure measurements and catheter removal tomorrow morning. Electronically Signed   By: Sandi Mariscal M.D.   On: 08/15/2020 15:19   ECHOCARDIOGRAM LIMITED  Result Date: 08/14/2020    ECHOCARDIOGRAM LIMITED REPORT   Patient Name:   Bobby Fields Date of Exam: 08/14/2020 Medical Rec #:  [id number]     Height:       71.0 in Accession #:    [id number]    Weight:       200.0 lb Date of Birth:  [birth year]      BSA:          2.109 m Patient Age:    70 years      BP:           116/83 mmHg Patient Gender: M             HR:           74 bpm. Exam Location:  Inpatient Procedure: Limited Echo, Cardiac Doppler, Limited Color Doppler and Intracardiac            Opacification Agent Indications:  Atrial Fibrillation I48.91  History:        Patient has no prior history of Echocardiogram examinations.                 Risk Factors:Hypertension, Diabetes and Dyslipidemia. COVID 19.  Sonographer:    Darlina Sicilian RDCS Referring Phys: [id number] [place]  1. Left ventricular ejection fraction, by estimation, is 65 to 70%. The left ventricle has normal function. Left ventricular diastolic parameters were normal.  2. Right ventricular systolic function is moderately reduced. The right ventricular size is  mildly enlarged.  3. The aortic valve is grossly normal. Mild aortic valve sclerosis is present, with no evidence of aortic valve stenosis. FINDINGS  Left Ventricle: Left ventricular ejection fraction, by estimation, is 65 to 70%. The left ventricle has normal function. Definity contrast agent was given IV to delineate the left ventricular endocardial borders. Left ventricular diastolic parameters were normal. Right Ventricle: The right ventricular size is mildly enlarged. Right ventricular systolic function is moderately reduced. Mitral Valve: Mild mitral annular calcification. Tricuspid Valve: Tricuspid valve regurgitation is trivial. Aortic Valve: The aortic valve is grossly normal. Mild aortic valve sclerosis is present, with no evidence of aortic valve stenosis. Aortic valve mean gradient measures 7.0 mmHg. Aortic valve peak gradient measures 12.8 mmHg. Aortic valve area, by VTI measures 1.58 cm. LEFT VENTRICLE PLAX 2D LVIDd:         4.10 cm LVIDs:         3.50 cm LV PW:         0.90 cm LV IVS:        0.90 cm LVOT diam:     2.00 cm LV SV:         41 LV SV Index:   19 LVOT Area:     3.14 cm  LV Volumes (MOD) LV vol d, MOD A2C: 61.1 ml LV vol d, MOD A4C: 41.4 ml LV vol s, MOD A2C: 25.7 ml LV vol s, MOD A4C: 12.8 ml LV SV MOD A2C:     35.4 ml LV SV MOD A4C:     41.4 ml LV SV MOD BP:      37.8 ml AORTIC VALVE AV Area (Vmax):    1.72 cm AV Area (Vmean):   1.62 cm AV Area (VTI):     1.58 cm AV Vmax:           179.00 cm/s AV Vmean:          117.000 cm/s AV VTI:            0.258 m AV Peak Grad:      12.8 mmHg AV Mean Grad:      7.0 mmHg LVOT Vmax:         97.90 cm/s LVOT Vmean:        60.500 cm/s LVOT VTI:          0.130 m LVOT/AV VTI ratio: 0.50 MITRAL VALVE MV Area (PHT): 3.39 cm    SHUNTS MV Decel Time: 224 msec    Systemic VTI:  0.13 m MV E velocity: 72.87 cm/s  Systemic Diam: 2.00 cm Candee Furbish MD Electronically signed by Candee Furbish MD Signature Date/Time: 08/14/2020/2:17:06 PM    Final    IR INFUSION  THROMBOL ARTERIAL INITIAL (MS)  Result Date: 08/15/2020 INDICATION: History of sub massive pulmonary embolism. Patient presents now for bilateral pulmonary artery catheter directed thrombolysis. EXAM: 1. ULTRASOUND GUIDANCE FOR VENOUS ACCESS X2 2. PULMONARY ARTERIOGRAPHY 3. FLUOROSCOPIC GUIDED  PLACEMENT OF BILATERAL PULMONARY ARTERIAL LYTIC INFUSION CATHETERS COMPARISON:  Chest CTA-08/14/2020 MEDICATIONS: None ANESTHESIA/SEDATION: Moderate (conscious) sedation was employed during this procedure. A total of Versed 2 mg and Fentanyl 100 mcg was administered intravenously. Moderate Sedation Time: 56 minutes. The patient's level of consciousness and vital signs were monitored continuously by radiology nursing throughout the procedure under my direct supervision. CONTRAST:  25 mL OMNIPAQUE IOHEXOL 300 MG/ML  SOLN FLUOROSCOPY TIME:  28 minutes, 48 seconds (542 mGy) COMPLICATIONS: None immediate. TECHNIQUE: Informed written consent was obtained from the patient after a discussion of the risks, benefits and alternatives to treatment. Questions regarding the procedure were encouraged and answered. A timeout was performed prior to the initiation of the procedure. Ultrasound scanning was performed of the right groin and demonstrated wide patency of the right common femoral vein. As such, the right common femoral vein was selected venous access. The right groin was prepped and draped in the usual sterile fashion, and a sterile drape was applied covering the operative field. Maximum barrier sterile technique with sterile gowns and gloves were used for the procedure. A timeout was performed prior to the initiation of the procedure. Local anesthesia was provided with 1% lidocaine. Under direct ultrasound guidance, the right common femoral vein was accessed with a micro puncture kit ultimately allowing placement of a 6 French vascular sheath. Slightly cranial to this initial access, the right common femoral was again accessed with  an additional micropuncture kit ultimately allowing placement of an additional 6 French vascular sheath. Ultrasound images were saved for procedural documentation purposes. With the use of a glidewire, a vertebral catheter was advanced into the main pulmonary artery and a limited central pulmonary arteriogram was performed. Pressure measurements were then obtained from the main pulmonary artery. With the use of an exchange length stiff glidewire, a vertebral catheter was advanced to the level of the right pulmonary artery and a selective right pulmonary arteriogram was performed. Next, under intermittent fluoroscopic guidance, the vertebral catheter was exchanged for a 90/15 cm multi side-hole infusion catheter. Again, with the use of an exchange length stiff glidewire, vertebral catheter was advanced to the level of the left pulmonary artery and a selective left pulmonary arteriogram performed Next, under intermittent fluoroscopic guidance, the vertebral catheter was exchanged for a 90/10 cm multi side-hole infusion catheter. A postprocedural fluoroscopic image was obtained to document final catheter positioning. The external catheter tubing was secured at the right thigh and the lytic therapy was initiated. The patient tolerated the procedure well without immediate postprocedural complication. FINDINGS: Central pulmonary arteriogram was performed demonstrating appropriate positioning. There was great difficulty advancing the catheter through the right atrium and ventricle the level main artery secondary to tortuosity. Acquired pressure measurements: Main  pulmonary artery - 53/17; mean - 27 (normal: < 25/10) Following the procedure, the right infusion catheter tip overlies the mid medial aspect of the right lung while the left infusion catheter tip overlies the left hilum. The infusion catheters could not be advanced further secondary to marked tortuosity of the catheter tracts. IMPRESSION: 1. Successful  fluoroscopic guided initiation of bilateral catheter directed pulmonary arterial lysis for sub massive pulmonary embolism and right-sided heart strain. 2. Elevated pressure measurements within the main pulmonary artery compatible with critical pulmonary arterial hypertension. PLAN: - Initiate 12 hour tPA infusion. - A.M. chest radiograph to document infusion catheter positioning. - Will perform bedside pressure measurements and catheter removal tomorrow morning. Electronically Signed   By: Sandi Mariscal M.D.   On: 08/15/2020 15:19  IR INFUSION THROMBOL ARTERIAL INITIAL (MS)  Result Date: 08/15/2020 INDICATION: History of sub massive pulmonary embolism. Patient presents now for bilateral pulmonary artery catheter directed thrombolysis. EXAM: 1. ULTRASOUND GUIDANCE FOR VENOUS ACCESS X2 2. PULMONARY ARTERIOGRAPHY 3. FLUOROSCOPIC GUIDED PLACEMENT OF BILATERAL PULMONARY ARTERIAL LYTIC INFUSION CATHETERS COMPARISON:  Chest CTA-08/14/2020 MEDICATIONS: None ANESTHESIA/SEDATION: Moderate (conscious) sedation was employed during this procedure. A total of Versed 2 mg and Fentanyl 100 mcg was administered intravenously. Moderate Sedation Time: 56 minutes. The patient's level of consciousness and vital signs were monitored continuously by radiology nursing throughout the procedure under my direct supervision. CONTRAST:  25 mL OMNIPAQUE IOHEXOL 300 MG/ML  SOLN FLUOROSCOPY TIME:  28 minutes, 48 seconds (315 mGy) COMPLICATIONS: None immediate. TECHNIQUE: Informed written consent was obtained from the patient after a discussion of the risks, benefits and alternatives to treatment. Questions regarding the procedure were encouraged and answered. A timeout was performed prior to the initiation of the procedure. Ultrasound scanning was performed of the right groin and demonstrated wide patency of the right common femoral vein. As such, the right common femoral vein was selected venous access. The right groin was prepped and draped in  the usual sterile fashion, and a sterile drape was applied covering the operative field. Maximum barrier sterile technique with sterile gowns and gloves were used for the procedure. A timeout was performed prior to the initiation of the procedure. Local anesthesia was provided with 1% lidocaine. Under direct ultrasound guidance, the right common femoral vein was accessed with a micro puncture kit ultimately allowing placement of a 6 French vascular sheath. Slightly cranial to this initial access, the right common femoral was again accessed with an additional micropuncture kit ultimately allowing placement of an additional 6 French vascular sheath. Ultrasound images were saved for procedural documentation purposes. With the use of a glidewire, a vertebral catheter was advanced into the main pulmonary artery and a limited central pulmonary arteriogram was performed. Pressure measurements were then obtained from the main pulmonary artery. With the use of an exchange length stiff glidewire, a vertebral catheter was advanced to the level of the right pulmonary artery and a selective right pulmonary arteriogram was performed. Next, under intermittent fluoroscopic guidance, the vertebral catheter was exchanged for a 90/15 cm multi side-hole infusion catheter. Again, with the use of an exchange length stiff glidewire, vertebral catheter was advanced to the level of the left pulmonary artery and a selective left pulmonary arteriogram performed Next, under intermittent fluoroscopic guidance, the vertebral catheter was exchanged for a 90/10 cm multi side-hole infusion catheter. A postprocedural fluoroscopic image was obtained to document final catheter positioning. The external catheter tubing was secured at the right thigh and the lytic therapy was initiated. The patient tolerated the procedure well without immediate postprocedural complication. FINDINGS: Central pulmonary arteriogram was performed demonstrating appropriate  positioning. There was great difficulty advancing the catheter through the right atrium and ventricle the level main artery secondary to tortuosity. Acquired pressure measurements: Main  pulmonary artery - 53/17; mean - 27 (normal: < 25/10) Following the procedure, the right infusion catheter tip overlies the mid medial aspect of the right lung while the left infusion catheter tip overlies the left hilum. The infusion catheters could not be advanced further secondary to marked tortuosity of the catheter tracts. IMPRESSION: 1. Successful fluoroscopic guided initiation of bilateral catheter directed pulmonary arterial lysis for sub massive pulmonary embolism and right-sided heart strain. 2. Elevated pressure measurements within the main pulmonary artery compatible with critical pulmonary arterial hypertension.  PLAN: - Initiate 12 hour tPA infusion. - A.M. chest radiograph to document infusion catheter positioning. - Will perform bedside pressure measurements and catheter removal tomorrow morning. Electronically Signed   By: Sandi Mariscal M.D.   On: 08/15/2020 15:19    Cardiac Studies   August 14, 2020 echo personally reviewed Normal left ventricular function, 65% Moderately reduced right ventricular function  Assessment & Plan    #Massive pulmonary embolus Patient with syncope, evidence of significant right heart dysfunction, elevated troponin in setting of saddle pulmonary embolus. \ Now s/p catheter directed thrombolytic initiation. Stable.   For questions or updates, please contact [place] Please consult [email or web address] for contact info under Cardiology/STEMI.      Signed, Lars Mage, MD  08/16/2020, 7:55 AM

## 2020-08-16 NOTE — Progress Notes (Signed)
[place] for heparin Indication: atrial fibrillation  Labs: Recent Labs    08/13/20 1549 08/13/20 1549 08/13/20 1737 08/13/20 2130 08/14/20 0208 08/14/20 0609 08/14/20 1030 08/15/20 0205 08/15/20 0622 08/15/20 1134 08/15/20 1828 08/16/20 0210  HGB 16.5  --   --   --  14.8  --   --  13.9  --  15.3 15.5 14.2  HCT 49.9  --   --   --  47.9  --   --  43.3  --  48.4 47.3 43.5  PLT 141*  --   --   --  134*  --   --  145*  --  147* 144* 125*  HEPARINUNFRC  --    < >  --   --  0.33  --  0.31  --    < > 0.25* 0.23* 0.40  CREATININE 1.74*  --   --   --  1.57*  --   --  1.36*  --   --   --   --   TROPONINIHS 59*  --    < > 260*  --  245* 158*  --   --   --   --   --    < > = values in this interval not displayed.    Assessment: 70 yo male admitted for near syncope.  EMS was called and patient found to have atrial flutter with heart rate up to 140.  He is noted with bilateral PE and now on on catheter directed lysis in interventional radiology.   Heparin level this am 0.40 units/ml.  Fibrinogen and CBC stable.  tPA completed.  Goal of Therapy:  Heparin level 0.3-0.7 units/ml Monitor platelets by anticoagulation protocol: Yes  Plan:  -Continue heparin at 1500 units/hr -Daily heparin level, CBC -Monitor for bleeding complications  Excell Seltzer, PharmD Clinical Pharmacist 08/16/2020 3:38 AM

## 2020-08-16 NOTE — Progress Notes (Signed)
Entered room with [facility] @ [phone]; greeted the pt and confirmed identity. Inspected the  catheters before pulling the right sided infusion cath back 8cm. Then proceeded to take a presser measurement @ [phone], results were 48/22 (31). Started to hold pressure @ 0926 with a quick clot 4x4. stopped holding pressure at 0940, placed a dressing, and left room at [phone]

## 2020-08-17 ENCOUNTER — Encounter (HOSPITAL_COMMUNITY): Payer: Self-pay | Admitting: Internal Medicine

## 2020-08-17 ENCOUNTER — Other Ambulatory Visit: Payer: Self-pay

## 2020-08-17 DIAGNOSIS — I2602 Saddle embolus of pulmonary artery with acute cor pulmonale: Secondary | ICD-10-CM | POA: Diagnosis not present

## 2020-08-17 DIAGNOSIS — I483 Typical atrial flutter: Secondary | ICD-10-CM | POA: Diagnosis not present

## 2020-08-17 LAB — GLUCOSE, CAPILLARY
Glucose-Capillary: 140 mg/dL — ABNORMAL HIGH (ref 70–99)
Glucose-Capillary: 118 mg/dL — ABNORMAL HIGH (ref 70–99)
Glucose-Capillary: 166 mg/dL — ABNORMAL HIGH (ref 70–99)
Glucose-Capillary: 182 mg/dL — ABNORMAL HIGH (ref 70–99)

## 2020-08-17 LAB — CBC
HCT: 46.1 % (ref 39.0–52.0)
Hemoglobin: 14.9 g/dL (ref 13.0–17.0)
MCH: 27.7 pg (ref 26.0–34.0)
MCHC: 32.3 g/dL (ref 30.0–36.0)
MCV: 85.8 fL (ref 80.0–100.0)
Platelets: 128 10*3/uL — ABNORMAL LOW (ref 150–400)
RBC: 5.37 MIL/uL (ref 4.22–5.81)
RDW: 13.4 % (ref 11.5–15.5)
WBC: 8.6 10*3/uL (ref 4.0–10.5)
nRBC: 0 % (ref 0.0–0.2)

## 2020-08-17 LAB — BASIC METABOLIC PANEL
Anion gap: 12 (ref 5–15)
BUN: 12 mg/dL (ref 8–23)
CO2: 20 mmol/L — ABNORMAL LOW (ref 22–32)
Calcium: 8.6 mg/dL — ABNORMAL LOW (ref 8.9–10.3)
Chloride: 103 mmol/L (ref 98–111)
Creatinine, Ser: 1.47 mg/dL — ABNORMAL HIGH (ref 0.61–1.24)
GFR, Estimated: 51 mL/min — ABNORMAL LOW (ref >60)
Glucose, Bld: 136 mg/dL — ABNORMAL HIGH (ref 70–99)
Potassium: 3.8 mmol/L (ref 3.5–5.1)
Sodium: 135 mmol/L (ref 135–145)

## 2020-08-17 NOTE — Progress Notes (Addendum)
Subjective:   Hospital day: 3  Overnight event: No acute event  [name] was evaluated at bedside this morning. He reports feeling well this morning sitting up in bedside chair. He notes that he was able to walk around the unit twice without any symptoms. He does endorse some chest tingling but denies any chest pressure or pain at this time.   Objective:  Vital signs in last 24 hours: Vitals:   08/17/20 0100 08/17/20 0200 08/17/20 0300 08/17/20 0400  BP: 112/80 109/75 121/82 96/65  Pulse: 80 80 80 81  Resp: (!) 23 (!) 22 20 20   Temp:      TempSrc:      SpO2: 95% 94% 93% 91%  Weight:      Height:       CBC Latest Ref Rng & Units 08/17/2020 08/16/2020 08/16/2020  WBC 4.0 - 10.5 K/uL 8.6 9.6 10.1  Hemoglobin 13.0 - 17.0 g/dL 14.9 14.5 13.9  Hematocrit 39.0 - 52.0 % 46.1 45.3 41.8  Platelets 150 - 400 K/uL 128(L) 121(L) 129(L)   CMP Latest Ref Rng & Units 08/17/2020 08/16/2020 08/15/2020  Glucose 70 - 99 mg/dL 136(H) 96 112(H)  BUN 8 - 23 mg/dL 12 14 21   Creatinine 0.61 - 1.24 mg/dL 1.47(H) 1.31(H) 1.36(H)  Sodium 135 - 145 mmol/L 135 136 136  Potassium 3.5 - 5.1 mmol/L 3.8 4.1 3.9  Chloride 98 - 111 mmol/L 103 104 105  CO2 22 - 32 mmol/L 20(L) 21(L) 22  Calcium 8.9 - 10.3 mg/dL 8.6(L) 8.3(L) 8.3(L)    Physical Exam  Physical Exam Constitutional:      General: He is not in acute distress. HENT:     Head: Normocephalic.  Eyes:     General:        Right eye: No discharge.        Left eye: No discharge.     Conjunctiva/sclera: Conjunctivae normal.  Cardiovascular:     Rate and Rhythm: Normal rate. Rhythm irregular.  Pulmonary:     Effort: Pulmonary effort is normal. No respiratory distress.     Breath sounds: Normal breath sounds.  Abdominal:     General: Bowel sounds are normal.     Tenderness: There is no abdominal tenderness.  Musculoskeletal:     Right lower leg: No edema.     Left lower leg: No edema.  Skin:    General: Skin is warm.     Comments:  Bandaged wound of Right groin, not actively bleeding.   Wrapped left upper arm due to swelling.  Neurological:     Mental Status: He is alert.  Psychiatric:        Mood and Affect: Mood normal.     Assessment/Plan: [name] a 70 y.o.malewith hx of type 2 diabetes, hyperlipidemia, ulcerative colitis status post colectomy 2013, Covid infection 1 month ago, admitted for atrial flutter found to have submassive PE.   Active Problems:   Atrial flutter (HCC)  Submassive Pulmonary Embolism CTA shows submassive saddle PE status post catheter directed thrombolytics with IR in the ICU. There is a slight reduction in peak systolic pressure with the pulmonary artery after procedure. Patient was started on Eliquis and continue for 6 months.  -Continue Eliquis for 6 months   Atrial flutter: Patient presented with newly diagnosed atrial flutter in setting of near syncope episode.  His heart rate is still controlled without any medications.   -Anticoagulate with Eliquis   AKI on CKD-resolved Creatine back to  baseline -Continue to monitor   Diabetes mellitus  CBG at goal  -Sliding scale insulin  -Monitor CBG   HLD -Continue Pravastatin  -Continue Zetia  Code status: FULL Diet: Carb modified DVT Prophylaxis: Eliquis Family Communication: Wife updated at bedside  Prior to Admission Living Arrangement: Home Anticipated Discharge Location: Home Barriers to Discharge: Continued medical management  Dispo: Anticipated discharge in approximately 1 day(s).   [name], DO 08/17/2020, 5:53 AM Pager: [phone] After 5pm on weekdays and 1pm on weekends: On Call pager [phone]

## 2020-08-17 NOTE — Plan of Care (Signed)
  Problem: Education: Goal: Knowledge of General Education information will improve Description Including pain rating scale, medication(s)/side effects and non-pharmacologic comfort measures Outcome: Progressing   

## 2020-08-17 NOTE — Discharge Summary (Addendum)
Name: Bobby Fields MRN: [id number] DOB: 1951/06/20 70 y.o. PCP: Nicoletta Dress, MD  Date of Admission: 08/13/2020  3:34 PM Date of Discharge: 08/18/2020 Attending Physician: Aldine Contes, MD  Discharge Diagnosis: 1.  Submassive pulmonary embolism 2.  Atrial flutter 3.  AKI 4.  Diabetes mellitus  Discharge Medications: Allergies as of 08/18/2020      Reactions   Indomethacin Diarrhea   Other reaction(s): Other (See Comments) "caused ulcerative colitis;bleeding ulcers"      Medication List    TAKE these medications   apixaban 5 MG Tabs tablet Commonly known as: ELIQUIS Take 2 tablets (10 mg total) by mouth 2 (two) times daily for 11 doses.   apixaban 5 MG Tabs tablet Commonly known as: ELIQUIS Take 1 tablet (5 mg total) by mouth 2 (two) times daily. Start taking on: August 24, 2020   Calcium Carb-Cholecalciferol 600-800 MG-UNIT Tabs Take 1 tablet by mouth 2 (two) times daily.   ezetimibe 10 MG tablet Commonly known as: ZETIA Take 10 mg by mouth daily.   glimepiride 4 MG tablet Commonly known as: AMARYL Take 4 mg by mouth daily with breakfast.   Januvia 100 MG tablet Generic drug: sitaGLIPtin Take 100 mg by mouth daily.   lisinopril 5 MG tablet Commonly known as: ZESTRIL Take 5 mg by mouth at bedtime.   omeprazole 20 MG capsule Commonly known as: PRILOSEC Take 20 mg by mouth daily.   pravastatin 20 MG tablet Commonly known as: PRAVACHOL Take 20 mg by mouth once a week. Sundays   Tresiba FlexTouch 100 UNIT/ML FlexTouch Pen Generic drug: insulin degludec Inject 20 Units into the skin at bedtime.       Disposition and follow-up:   [name] was discharged from [facility] in Stable condition.  At the hospital follow up visit please address:  1.    Submassive pulmonary embolism Patient underwent catheter directed thrombolytics.  Eliquis was started -Follow-up on Eliquis adherence -Can repeat echocardiogram in several  weeks to assess for right ventricular size and function  Atrial flutter Currently rate controlled.  Anticoagulated with Eliquis -Can consider cardioversion when patient is more stable  AKI -Follow-up with PCP.  Repeat BMP  Diabetes Hemoglobin A1c 6.4.   -Resume home medications  2.  Labs / imaging needed at time of follow-up: CBC, BMP  3.  Pending labs/ test needing follow-up: N/A  Follow-up Appointments:  Follow-up Information    Nicoletta Dress, MD Follow up.   Specialty: Internal Medicine Contact information: [place] [address] [phone]        Jerline Pain, MD .   Specialty: Cardiology Contact information: [phone] [place] [address] [phone]              HPI Patient is seen at bedside. Patient appears comfortable with no acute distress. He denies chest pain or shortness of breath. Patient reports walking fine with no difficulty. Plan to follow up with PCP in 1 week and Cardiology in 2 weeks.  Hospital Course by problem list: 1.  Submassive pulmonary embolism Patient was admitted for a near syncopal episode, found to have a flutter with RVR.  Echocardiogram shows normal EF with mildly enlarged right ventricular size.  CTA shows extensive bilateral pulmonary emboli with saddle embolus consistent with submassive PE.  The underlying cause of his PE is likely due to Covid infection.  Patient was transferred to the ICU and underwent catheter directed  thrombolytics with IR.  There was a slight reduction in peak systolic pressure with the pulmonary artery after procedure. He was started on Eliquis for anticoagulation.  2.  Atrial flutter with RVR Patient presents to the ED with atrial flutter with RVR, heart rate was in the 130s.  The underlying cause of his a flutter is likely due to PE causing right heart strain.  His rhythm remained in a flutter but heart rate then normalized without any  intervention.  Patient was not started on any rate control medication during this admission.  He will be anticoagulated with Eliquis.  Plan for cardioversion in a few weeks when patient is more stable.  3.  AKI on CKD Patient present with serum creatinine of 1.7 likely prerenal in the setting of decreased oral intake due to COVID-19 infection 1 month ago.  His creatinine baseline is 1.5 per Dr. Burnett Sheng, his PCP.  His creatinine improved to baseline with gentle fluid resuscitation.  Discharge Exam:   BP 132/87 (BP Location: Right Arm)   Pulse 80   Temp 97.8 F (36.6 C) (Oral)   Resp 20   Ht 5\' 11"  (1.803 m)   Wt 93 kg   SpO2 95%   BMI 28.60 kg/m  Discharge exam:   Physical Exam Constitutional:      General: He is not in acute distress.    Appearance: Normal appearance.  Eyes:     General:        Right eye: No discharge.        Left eye: No discharge.  Cardiovascular:     Rate and Rhythm: Normal rate and regular rhythm.     Heart sounds: No murmur heard.   Pulmonary:     Effort: Pulmonary effort is normal. No respiratory distress.  Abdominal:     General: Bowel sounds are normal.  Musculoskeletal:        General: Normal range of motion.     Cervical back: Normal range of motion.     Right lower leg: No edema.     Left lower leg: No edema.  Skin:    Comments: Improved hematoma on left upper arm  Neurological:     Mental Status: He is alert.  Psychiatric:        Mood and Affect: Mood normal.      Pertinent Labs, Studies, and Procedures:  CBC Latest Ref Rng & Units 08/18/2020 08/17/2020 08/16/2020  WBC 4.0 - 10.5 K/uL 8.0 8.6 9.6  Hemoglobin 13.0 - 17.0 g/dL 15.1 14.9 14.5  Hematocrit 39.0 - 52.0 % 44.2 46.1 45.3  Platelets 150 - 400 K/uL 168 128(L) 121(L)   CMP Latest Ref Rng & Units 08/18/2020 08/17/2020 08/16/2020  Glucose 70 - 99 mg/dL 122(H) 136(H) 96  BUN 8 - 23 mg/dL 14 12 14   Creatinine 0.61 - 1.24 mg/dL 1.40(H) 1.47(H) 1.31(H)  Sodium 135 - 145 mmol/L 135 135 136   Potassium 3.5 - 5.1 mmol/L 3.8 3.8 4.1  Chloride 98 - 111 mmol/L 101 103 104  CO2 22 - 32 mmol/L 22 20(L) 21(L)  Calcium 8.9 - 10.3 mg/dL 8.7(L) 8.6(L) 8.3(L)   DG Chest 2 View  Result Date: 08/13/2020 CLINICAL DATA:  Chest pain.  Presyncope. EXAM: CHEST - 2 VIEW COMPARISON:  None. FINDINGS: The cardiomediastinal contours are normal. Subsegmental bibasilar atelectasis. Pulmonary vasculature is normal. No consolidation, pleural effusion, or pneumothorax. No acute osseous abnormalities are seen. IMPRESSION: Subsegmental bibasilar atelectasis. Electronically Signed   By: Aurther Loft.D.  On: 08/13/2020 16:26   CT ANGIO CHEST PE W OR WO CONTRAST  IMPRESSION: Extensive bilateral pulmonary emboli including saddle embolus. CT evidence of right heart strain (RV/LV Ratio = 1.6) consistent with at least submassive (intermediate risk) PE. The presence of right heart strain has been associated with an increased risk of morbidity and mortality. Small scattered nodules in the right lower lobe, 4 mm or less. No follow-up needed if patient is low-risk (and has no known or suspected primary neoplasm). Non-contrast chest CT can be considered in 12 months if patient is high-risk. This recommendation follows the consensus statement: Guidelines for Management of Incidental Pulmonary Nodules Detected on CT Images: From the Fleischner Society 2017; Radiology 2017; 284:228-243. Critical Value/emergent results were called by telephone at the time of interpretation on 08/14/2020 at 9:57 pm to provider Dr. Koleen Distance, who verbally acknowledged these results. Electronically Signed   By: Rolm Baptise M.D.   On: 08/14/2020 22:19   ECHOCARDIOGRAM LIMITED Result Date: 08/14/2020 IMPRESSIONS  1. Left ventricular ejection fraction, by estimation, is 65 to 70%. The left ventricle has normal function. Left ventricular diastolic parameters were normal.  2. Right ventricular systolic function is moderately reduced. The right  ventricular size is mildly enlarged.  3. The aortic valve is grossly normal. Mild aortic valve sclerosis is present, with no evidence of aortic valve stenosis.   Discharge Instructions: Discharge Instructions    Amb referral to [facility]   Complete by: As directed    Call MD for:  difficulty breathing, headache or visual disturbances   Complete by: As directed    Call MD for:  severe uncontrolled pain   Complete by: As directed    Diet - low sodium heart healthy   Complete by: As directed    Discharge instructions   Complete by: As directed    Mr. Dubberly,  It was a pleasure taking care of you during this admission.  You were admitted for a large blood clots in your lungs that led to a abnormal rhythm called atrial flutter.  You were treated with blood thinner and you will continue taking this medication after discharge.  You will take Eliquis 10 mg twice daily for a total of 7 days and switch to 5 mg twice daily.   Please limit strenuous activities for the next week.  Please follow-up with your primary care doctor in 1 week and cardiologist in 2 weeks.  Take care   Increase activity slowly   Complete by: As directed    No wound care   Complete by: As directed       Signed: [name], DO 08/18/2020, 12:16 PM   Pager: [phone]

## 2020-08-17 NOTE — Progress Notes (Signed)
Progress Note  Patient Name: Bobby Fields Date of Encounter: 08/17/2020  [facility] Cardiologist: Werner Lean, MD   Subjective   Feeling much better, breathing better sitting up in chair.  Wife in room.  He had just walked 45 minutes prior to his syncopal episode, had put out 200 pounds of [place] in the yard.  Very active man.  Has ulcerative colitis, prior colectomy with J-pouch.  Inpatient Medications    Scheduled Meds: . apixaban  10 mg Oral BID   Followed by  . [START ON 08/24/2020] apixaban  5 mg Oral BID  . Chlorhexidine Gluconate Cloth  6 each Topical Daily  . ezetimibe  10 mg Oral Daily  . insulin aspart  0-15 Units Subcutaneous TID WC  . insulin aspart  0-5 Units Subcutaneous QHS  . insulin glargine  15 Units Subcutaneous QHS  . pantoprazole  40 mg Oral Daily  . pravastatin  20 mg Oral Q Sun  . sodium chloride flush  3 mL Intravenous Q12H  . sodium chloride flush  3 mL Intravenous Q12H   Continuous Infusions: . sodium chloride Stopped (08/16/20 0917)  . sodium chloride    . sodium chloride    . sodium chloride Stopped (08/16/20 0916)  . sodium chloride Stopped (08/16/20 0916)  . sodium chloride Stopped (08/16/20 0916)   PRN Meds: sodium chloride, sodium chloride, acetaminophen, ondansetron (ZOFRAN) IV, sodium chloride flush, sodium chloride flush   Vital Signs    Vitals:   08/17/20 0400 08/17/20 0500 08/17/20 0600 08/17/20 0735  BP: 96/65 106/73 110/86   Pulse: 81 80 80   Resp: '20 20 19   ' Temp: (!) 97.5 F (36.4 C)   (!) 97.3 F (36.3 C)  TempSrc: Oral   Axillary  SpO2: 91% 94% 95%   Weight:      Height:        Intake/Output Summary (Last 24 hours) at 08/17/2020 0858 Last data filed at 08/17/2020 0200 Gross per 24 hour  Intake 1266.22 ml  Output 450 ml  Net 816.22 ml   Last 3 Weights 08/16/2020 08/15/2020 08/14/2020  Weight (lbs) 205 lb 0.4 oz 202 lb 4.8 oz 202 lb 3.2 oz  Weight (kg) 93 kg 91.763 kg 91.717 kg      Telemetry    Atrial  flutter variable conduction, longest AV block approximately 2 seconds- Personally Reviewed  ECG    Typical atrial flutter variable conduction from 08/14/2020- Personally Reviewed  Physical Exam   GEN: No acute distress.   Neck: No JVD Cardiac: RRR, occasional irregularity from flutter, no murmurs, rubs, or gallops.  Respiratory: Clear to auscultation bilaterally. GI: Soft, nontender, non-distended  MS: No edema; No deformity.  Right groin site intact, no significant hematoma Neuro:  Nonfocal  Psych: Normal affect   Labs    High Sensitivity Troponin:   Recent Labs  Lab 08/13/20 1549 08/13/20 1737 08/13/20 2130 08/14/20 0609 08/14/20 1030  TROPONINIHS 59* 156* 260* 245* 158*      Chemistry Recent Labs  Lab 08/15/20 0205 08/16/20 0430 08/17/20 0006  NA 136 136 135  K 3.9 4.1 3.8  CL 105 104 103  CO2 22 21* 20*  GLUCOSE 112* 96 136*  BUN '21 14 12  ' CREATININE 1.36* 1.31* 1.47*  CALCIUM 8.3* 8.3* 8.6*  GFRNONAA 56* 59* 51*  ANIONGAP '9 11 12     ' Hematology Recent Labs  Lab 08/16/20 0430 08/16/20 1103 08/17/20 0006  WBC 10.1 9.6 8.6  RBC 4.91 5.26 5.37  HGB 13.9 14.5 14.9  HCT 41.8 45.3 46.1  MCV 85.1 86.1 85.8  MCH 28.3 27.6 27.7  MCHC 33.3 32.0 32.3  RDW 13.6 13.4 13.4  PLT 129* 121* 128*    BNPNo results for input(s): BNP, PROBNP in the last 168 hours.   DDimer  Recent Labs  Lab 08/14/20 1640  DDIMER 14.27*     Radiology    IR Angiogram Pulmonary Bilateral Selective  Result Date: 08/15/2020 INDICATION: History of sub massive pulmonary embolism. Patient presents now for bilateral pulmonary artery catheter directed thrombolysis. EXAM: 1. ULTRASOUND GUIDANCE FOR VENOUS ACCESS X2 2. PULMONARY ARTERIOGRAPHY 3. FLUOROSCOPIC GUIDED PLACEMENT OF BILATERAL PULMONARY ARTERIAL LYTIC INFUSION CATHETERS COMPARISON:  Chest CTA-08/14/2020 MEDICATIONS: None ANESTHESIA/SEDATION: Moderate (conscious) sedation was employed during this procedure. A total of Versed 2  mg and Fentanyl 100 mcg was administered intravenously. Moderate Sedation Time: 56 minutes. The patient's level of consciousness and vital signs were monitored continuously by radiology nursing throughout the procedure under my direct supervision. CONTRAST:  25 mL OMNIPAQUE IOHEXOL 300 MG/ML  SOLN FLUOROSCOPY TIME:  28 minutes, 48 seconds (793 mGy) COMPLICATIONS: None immediate. TECHNIQUE: Informed written consent was obtained from the patient after a discussion of the risks, benefits and alternatives to treatment. Questions regarding the procedure were encouraged and answered. A timeout was performed prior to the initiation of the procedure. Ultrasound scanning was performed of the right groin and demonstrated wide patency of the right common femoral vein. As such, the right common femoral vein was selected venous access. The right groin was prepped and draped in the usual sterile fashion, and a sterile drape was applied covering the operative field. Maximum barrier sterile technique with sterile gowns and gloves were used for the procedure. A timeout was performed prior to the initiation of the procedure. Local anesthesia was provided with 1% lidocaine. Under direct ultrasound guidance, the right common femoral vein was accessed with a micro puncture kit ultimately allowing placement of a 6 French vascular sheath. Slightly cranial to this initial access, the right common femoral was again accessed with an additional micropuncture kit ultimately allowing placement of an additional 6 French vascular sheath. Ultrasound images were saved for procedural documentation purposes. With the use of a glidewire, a vertebral catheter was advanced into the main pulmonary artery and a limited central pulmonary arteriogram was performed. Pressure measurements were then obtained from the main pulmonary artery. With the use of an exchange length stiff glidewire, a vertebral catheter was advanced to the level of the right pulmonary  artery and a selective right pulmonary arteriogram was performed. Next, under intermittent fluoroscopic guidance, the vertebral catheter was exchanged for a 90/15 cm multi side-hole infusion catheter. Again, with the use of an exchange length stiff glidewire, vertebral catheter was advanced to the level of the left pulmonary artery and a selective left pulmonary arteriogram performed Next, under intermittent fluoroscopic guidance, the vertebral catheter was exchanged for a 90/10 cm multi side-hole infusion catheter. A postprocedural fluoroscopic image was obtained to document final catheter positioning. The external catheter tubing was secured at the right thigh and the lytic therapy was initiated. The patient tolerated the procedure well without immediate postprocedural complication. FINDINGS: Central pulmonary arteriogram was performed demonstrating appropriate positioning. There was great difficulty advancing the catheter through the right atrium and ventricle the level main artery secondary to tortuosity. Acquired pressure measurements: Main  pulmonary artery - 53/17; mean - 27 (normal: < 25/10) Following the procedure, the right infusion catheter tip overlies the mid medial  aspect of the right lung while the left infusion catheter tip overlies the left hilum. The infusion catheters could not be advanced further secondary to marked tortuosity of the catheter tracts. IMPRESSION: 1. Successful fluoroscopic guided initiation of bilateral catheter directed pulmonary arterial lysis for sub massive pulmonary embolism and right-sided heart strain. 2. Elevated pressure measurements within the main pulmonary artery compatible with critical pulmonary arterial hypertension. PLAN: - Initiate 12 hour tPA infusion. - A.M. chest radiograph to document infusion catheter positioning. - Will perform bedside pressure measurements and catheter removal tomorrow morning. Electronically Signed   By: Sandi Mariscal M.D.   On: 08/15/2020  15:19   IR Angiogram Selective Each Additional Vessel  Result Date: 08/15/2020 INDICATION: History of sub massive pulmonary embolism. Patient presents now for bilateral pulmonary artery catheter directed thrombolysis. EXAM: 1. ULTRASOUND GUIDANCE FOR VENOUS ACCESS X2 2. PULMONARY ARTERIOGRAPHY 3. FLUOROSCOPIC GUIDED PLACEMENT OF BILATERAL PULMONARY ARTERIAL LYTIC INFUSION CATHETERS COMPARISON:  Chest CTA-08/14/2020 MEDICATIONS: None ANESTHESIA/SEDATION: Moderate (conscious) sedation was employed during this procedure. A total of Versed 2 mg and Fentanyl 100 mcg was administered intravenously. Moderate Sedation Time: 56 minutes. The patient's level of consciousness and vital signs were monitored continuously by radiology nursing throughout the procedure under my direct supervision. CONTRAST:  25 mL OMNIPAQUE IOHEXOL 300 MG/ML  SOLN FLUOROSCOPY TIME:  28 minutes, 48 seconds (811 mGy) COMPLICATIONS: None immediate. TECHNIQUE: Informed written consent was obtained from the patient after a discussion of the risks, benefits and alternatives to treatment. Questions regarding the procedure were encouraged and answered. A timeout was performed prior to the initiation of the procedure. Ultrasound scanning was performed of the right groin and demonstrated wide patency of the right common femoral vein. As such, the right common femoral vein was selected venous access. The right groin was prepped and draped in the usual sterile fashion, and a sterile drape was applied covering the operative field. Maximum barrier sterile technique with sterile gowns and gloves were used for the procedure. A timeout was performed prior to the initiation of the procedure. Local anesthesia was provided with 1% lidocaine. Under direct ultrasound guidance, the right common femoral vein was accessed with a micro puncture kit ultimately allowing placement of a 6 French vascular sheath. Slightly cranial to this initial access, the right common  femoral was again accessed with an additional micropuncture kit ultimately allowing placement of an additional 6 French vascular sheath. Ultrasound images were saved for procedural documentation purposes. With the use of a glidewire, a vertebral catheter was advanced into the main pulmonary artery and a limited central pulmonary arteriogram was performed. Pressure measurements were then obtained from the main pulmonary artery. With the use of an exchange length stiff glidewire, a vertebral catheter was advanced to the level of the right pulmonary artery and a selective right pulmonary arteriogram was performed. Next, under intermittent fluoroscopic guidance, the vertebral catheter was exchanged for a 90/15 cm multi side-hole infusion catheter. Again, with the use of an exchange length stiff glidewire, vertebral catheter was advanced to the level of the left pulmonary artery and a selective left pulmonary arteriogram performed Next, under intermittent fluoroscopic guidance, the vertebral catheter was exchanged for a 90/10 cm multi side-hole infusion catheter. A postprocedural fluoroscopic image was obtained to document final catheter positioning. The external catheter tubing was secured at the right thigh and the lytic therapy was initiated. The patient tolerated the procedure well without immediate postprocedural complication. FINDINGS: Central pulmonary arteriogram was performed demonstrating appropriate positioning. There was great difficulty  advancing the catheter through the right atrium and ventricle the level main artery secondary to tortuosity. Acquired pressure measurements: Main  pulmonary artery - 53/17; mean - 27 (normal: < 25/10) Following the procedure, the right infusion catheter tip overlies the mid medial aspect of the right lung while the left infusion catheter tip overlies the left hilum. The infusion catheters could not be advanced further secondary to marked tortuosity of the catheter tracts.  IMPRESSION: 1. Successful fluoroscopic guided initiation of bilateral catheter directed pulmonary arterial lysis for sub massive pulmonary embolism and right-sided heart strain. 2. Elevated pressure measurements within the main pulmonary artery compatible with critical pulmonary arterial hypertension. PLAN: - Initiate 12 hour tPA infusion. - A.M. chest radiograph to document infusion catheter positioning. - Will perform bedside pressure measurements and catheter removal tomorrow morning. Electronically Signed   By: Sandi Mariscal M.D.   On: 08/15/2020 15:19   IR Angiogram Selective Each Additional Vessel  Result Date: 08/15/2020 INDICATION: History of sub massive pulmonary embolism. Patient presents now for bilateral pulmonary artery catheter directed thrombolysis. EXAM: 1. ULTRASOUND GUIDANCE FOR VENOUS ACCESS X2 2. PULMONARY ARTERIOGRAPHY 3. FLUOROSCOPIC GUIDED PLACEMENT OF BILATERAL PULMONARY ARTERIAL LYTIC INFUSION CATHETERS COMPARISON:  Chest CTA-08/14/2020 MEDICATIONS: None ANESTHESIA/SEDATION: Moderate (conscious) sedation was employed during this procedure. A total of Versed 2 mg and Fentanyl 100 mcg was administered intravenously. Moderate Sedation Time: 56 minutes. The patient's level of consciousness and vital signs were monitored continuously by radiology nursing throughout the procedure under my direct supervision. CONTRAST:  25 mL OMNIPAQUE IOHEXOL 300 MG/ML  SOLN FLUOROSCOPY TIME:  28 minutes, 48 seconds (407 mGy) COMPLICATIONS: None immediate. TECHNIQUE: Informed written consent was obtained from the patient after a discussion of the risks, benefits and alternatives to treatment. Questions regarding the procedure were encouraged and answered. A timeout was performed prior to the initiation of the procedure. Ultrasound scanning was performed of the right groin and demonstrated wide patency of the right common femoral vein. As such, the right common femoral vein was selected venous access. The right  groin was prepped and draped in the usual sterile fashion, and a sterile drape was applied covering the operative field. Maximum barrier sterile technique with sterile gowns and gloves were used for the procedure. A timeout was performed prior to the initiation of the procedure. Local anesthesia was provided with 1% lidocaine. Under direct ultrasound guidance, the right common femoral vein was accessed with a micro puncture kit ultimately allowing placement of a 6 French vascular sheath. Slightly cranial to this initial access, the right common femoral was again accessed with an additional micropuncture kit ultimately allowing placement of an additional 6 French vascular sheath. Ultrasound images were saved for procedural documentation purposes. With the use of a glidewire, a vertebral catheter was advanced into the main pulmonary artery and a limited central pulmonary arteriogram was performed. Pressure measurements were then obtained from the main pulmonary artery. With the use of an exchange length stiff glidewire, a vertebral catheter was advanced to the level of the right pulmonary artery and a selective right pulmonary arteriogram was performed. Next, under intermittent fluoroscopic guidance, the vertebral catheter was exchanged for a 90/15 cm multi side-hole infusion catheter. Again, with the use of an exchange length stiff glidewire, vertebral catheter was advanced to the level of the left pulmonary artery and a selective left pulmonary arteriogram performed Next, under intermittent fluoroscopic guidance, the vertebral catheter was exchanged for a 90/10 cm multi side-hole infusion catheter. A postprocedural fluoroscopic image was obtained to  document final catheter positioning. The external catheter tubing was secured at the right thigh and the lytic therapy was initiated. The patient tolerated the procedure well without immediate postprocedural complication. FINDINGS: Central pulmonary arteriogram was  performed demonstrating appropriate positioning. There was great difficulty advancing the catheter through the right atrium and ventricle the level main artery secondary to tortuosity. Acquired pressure measurements: Main  pulmonary artery - 53/17; mean - 27 (normal: < 25/10) Following the procedure, the right infusion catheter tip overlies the mid medial aspect of the right lung while the left infusion catheter tip overlies the left hilum. The infusion catheters could not be advanced further secondary to marked tortuosity of the catheter tracts. IMPRESSION: 1. Successful fluoroscopic guided initiation of bilateral catheter directed pulmonary arterial lysis for sub massive pulmonary embolism and right-sided heart strain. 2. Elevated pressure measurements within the main pulmonary artery compatible with critical pulmonary arterial hypertension. PLAN: - Initiate 12 hour tPA infusion. - A.M. chest radiograph to document infusion catheter positioning. - Will perform bedside pressure measurements and catheter removal tomorrow morning. Electronically Signed   By: Sandi Mariscal M.D.   On: 08/15/2020 15:19   DG Chest Port 1 View  Result Date: 08/16/2020 CLINICAL DATA:  70 year old male with bilateral/sub massive pulmonary embolus. Status post pulmonary artery catheter directed thrombo lysis yesterday. EXAM: PORTABLE CHEST 1 VIEW COMPARISON:  CTA chest 08/14/2020.  Chest radiographs 08/13/2020. FINDINGS: Portable AP semi upright view at 0437 hours. Inferior approach pulmonary artery lysis catheters extend toward both hila, that on the right is somewhat more peripheral. Larger, improved lung volumes compared to 08/13/2020. Stable mediastinal contours. Allowing for portable technique the lungs are clear. No pneumothorax. No acute osseous abnormality identified. Paucity of bowel gas in the upper abdomen. IMPRESSION: 1. Bilateral pulmonary artery lysis catheters extend to the hila, the right somewhat more peripheral. 2.  No new  cardiopulmonary abnormality. Electronically Signed   By: Genevie Ann M.D.   On: 08/16/2020 08:17   IR INFUSION THROMBOL ARTERIAL INITIAL (MS)  Result Date: 08/15/2020 INDICATION: History of sub massive pulmonary embolism. Patient presents now for bilateral pulmonary artery catheter directed thrombolysis. EXAM: 1. ULTRASOUND GUIDANCE FOR VENOUS ACCESS X2 2. PULMONARY ARTERIOGRAPHY 3. FLUOROSCOPIC GUIDED PLACEMENT OF BILATERAL PULMONARY ARTERIAL LYTIC INFUSION CATHETERS COMPARISON:  Chest CTA-08/14/2020 MEDICATIONS: None ANESTHESIA/SEDATION: Moderate (conscious) sedation was employed during this procedure. A total of Versed 2 mg and Fentanyl 100 mcg was administered intravenously. Moderate Sedation Time: 56 minutes. The patient's level of consciousness and vital signs were monitored continuously by radiology nursing throughout the procedure under my direct supervision. CONTRAST:  25 mL OMNIPAQUE IOHEXOL 300 MG/ML  SOLN FLUOROSCOPY TIME:  28 minutes, 48 seconds (563 mGy) COMPLICATIONS: None immediate. TECHNIQUE: Informed written consent was obtained from the patient after a discussion of the risks, benefits and alternatives to treatment. Questions regarding the procedure were encouraged and answered. A timeout was performed prior to the initiation of the procedure. Ultrasound scanning was performed of the right groin and demonstrated wide patency of the right common femoral vein. As such, the right common femoral vein was selected venous access. The right groin was prepped and draped in the usual sterile fashion, and a sterile drape was applied covering the operative field. Maximum barrier sterile technique with sterile gowns and gloves were used for the procedure. A timeout was performed prior to the initiation of the procedure. Local anesthesia was provided with 1% lidocaine. Under direct ultrasound guidance, the right common femoral vein was accessed with a micro  puncture kit ultimately allowing placement of a 6  French vascular sheath. Slightly cranial to this initial access, the right common femoral was again accessed with an additional micropuncture kit ultimately allowing placement of an additional 6 French vascular sheath. Ultrasound images were saved for procedural documentation purposes. With the use of a glidewire, a vertebral catheter was advanced into the main pulmonary artery and a limited central pulmonary arteriogram was performed. Pressure measurements were then obtained from the main pulmonary artery. With the use of an exchange length stiff glidewire, a vertebral catheter was advanced to the level of the right pulmonary artery and a selective right pulmonary arteriogram was performed. Next, under intermittent fluoroscopic guidance, the vertebral catheter was exchanged for a 90/15 cm multi side-hole infusion catheter. Again, with the use of an exchange length stiff glidewire, vertebral catheter was advanced to the level of the left pulmonary artery and a selective left pulmonary arteriogram performed Next, under intermittent fluoroscopic guidance, the vertebral catheter was exchanged for a 90/10 cm multi side-hole infusion catheter. A postprocedural fluoroscopic image was obtained to document final catheter positioning. The external catheter tubing was secured at the right thigh and the lytic therapy was initiated. The patient tolerated the procedure well without immediate postprocedural complication. FINDINGS: Central pulmonary arteriogram was performed demonstrating appropriate positioning. There was great difficulty advancing the catheter through the right atrium and ventricle the level main artery secondary to tortuosity. Acquired pressure measurements: Main  pulmonary artery - 53/17; mean - 27 (normal: < 25/10) Following the procedure, the right infusion catheter tip overlies the mid medial aspect of the right lung while the left infusion catheter tip overlies the left hilum. The infusion catheters could  not be advanced further secondary to marked tortuosity of the catheter tracts. IMPRESSION: 1. Successful fluoroscopic guided initiation of bilateral catheter directed pulmonary arterial lysis for sub massive pulmonary embolism and right-sided heart strain. 2. Elevated pressure measurements within the main pulmonary artery compatible with critical pulmonary arterial hypertension. PLAN: - Initiate 12 hour tPA infusion. - A.M. chest radiograph to document infusion catheter positioning. - Will perform bedside pressure measurements and catheter removal tomorrow morning. Electronically Signed   By: Sandi Mariscal M.D.   On: 08/15/2020 15:19   IR INFUSION THROMBOL ARTERIAL INITIAL (MS)  Result Date: 08/15/2020 INDICATION: History of sub massive pulmonary embolism. Patient presents now for bilateral pulmonary artery catheter directed thrombolysis. EXAM: 1. ULTRASOUND GUIDANCE FOR VENOUS ACCESS X2 2. PULMONARY ARTERIOGRAPHY 3. FLUOROSCOPIC GUIDED PLACEMENT OF BILATERAL PULMONARY ARTERIAL LYTIC INFUSION CATHETERS COMPARISON:  Chest CTA-08/14/2020 MEDICATIONS: None ANESTHESIA/SEDATION: Moderate (conscious) sedation was employed during this procedure. A total of Versed 2 mg and Fentanyl 100 mcg was administered intravenously. Moderate Sedation Time: 56 minutes. The patient's level of consciousness and vital signs were monitored continuously by radiology nursing throughout the procedure under my direct supervision. CONTRAST:  25 mL OMNIPAQUE IOHEXOL 300 MG/ML  SOLN FLUOROSCOPY TIME:  28 minutes, 48 seconds (166 mGy) COMPLICATIONS: None immediate. TECHNIQUE: Informed written consent was obtained from the patient after a discussion of the risks, benefits and alternatives to treatment. Questions regarding the procedure were encouraged and answered. A timeout was performed prior to the initiation of the procedure. Ultrasound scanning was performed of the right groin and demonstrated wide patency of the right common femoral vein. As  such, the right common femoral vein was selected venous access. The right groin was prepped and draped in the usual sterile fashion, and a sterile drape was applied covering the operative field. Maximum barrier  sterile technique with sterile gowns and gloves were used for the procedure. A timeout was performed prior to the initiation of the procedure. Local anesthesia was provided with 1% lidocaine. Under direct ultrasound guidance, the right common femoral vein was accessed with a micro puncture kit ultimately allowing placement of a 6 French vascular sheath. Slightly cranial to this initial access, the right common femoral was again accessed with an additional micropuncture kit ultimately allowing placement of an additional 6 French vascular sheath. Ultrasound images were saved for procedural documentation purposes. With the use of a glidewire, a vertebral catheter was advanced into the main pulmonary artery and a limited central pulmonary arteriogram was performed. Pressure measurements were then obtained from the main pulmonary artery. With the use of an exchange length stiff glidewire, a vertebral catheter was advanced to the level of the right pulmonary artery and a selective right pulmonary arteriogram was performed. Next, under intermittent fluoroscopic guidance, the vertebral catheter was exchanged for a 90/15 cm multi side-hole infusion catheter. Again, with the use of an exchange length stiff glidewire, vertebral catheter was advanced to the level of the left pulmonary artery and a selective left pulmonary arteriogram performed Next, under intermittent fluoroscopic guidance, the vertebral catheter was exchanged for a 90/10 cm multi side-hole infusion catheter. A postprocedural fluoroscopic image was obtained to document final catheter positioning. The external catheter tubing was secured at the right thigh and the lytic therapy was initiated. The patient tolerated the procedure well without immediate  postprocedural complication. FINDINGS: Central pulmonary arteriogram was performed demonstrating appropriate positioning. There was great difficulty advancing the catheter through the right atrium and ventricle the level main artery secondary to tortuosity. Acquired pressure measurements: Main  pulmonary artery - 53/17; mean - 27 (normal: < 25/10) Following the procedure, the right infusion catheter tip overlies the mid medial aspect of the right lung while the left infusion catheter tip overlies the left hilum. The infusion catheters could not be advanced further secondary to marked tortuosity of the catheter tracts. IMPRESSION: 1. Successful fluoroscopic guided initiation of bilateral catheter directed pulmonary arterial lysis for sub massive pulmonary embolism and right-sided heart strain. 2. Elevated pressure measurements within the main pulmonary artery compatible with critical pulmonary arterial hypertension. PLAN: - Initiate 12 hour tPA infusion. - A.M. chest radiograph to document infusion catheter positioning. - Will perform bedside pressure measurements and catheter removal tomorrow morning. Electronically Signed   By: Sandi Mariscal M.D.   On: 08/15/2020 15:19   IR THROMB F/U EVAL ART/VEN FINAL DAY (MS)  Result Date: 08/16/2020 INDICATION: History of sub massive pulmonary embolism, post initiation of bilateral pulmonary arterial thrombolysis on 08/15/2020. Patient has completed prescribed course of bilateral pulmonary arterial lytic infusion and presents today for repeat pulmonary arterial pressure measurements prior to infusion catheter removal. Patient states he is subjectively improved since undergoing the lytic infusion with decreased work of breathing. EXAM: PULMONARY ARTERIAL PRESSURE MEASUREMENT AND INFUSION CATHETER(S) REMOVAL COMPARISON:  Initiation of bilateral pulmonary arterial catheter directed thrombolysis-08/15/2020 Chest CTA-08/14/2020 Chest Radiograph - earlier same day MEDICATIONS: None  CONTRAST:  None FLUOROSCOPY TIME:  None COMPLICATIONS: None immediate. TECHNIQUE: The patient was positioned supine on his hospital bed. Review of this morning's chest radiograph demonstrates unchanged positioning of bilateral pulmonary arterial infusion catheters with tip of the right-sided pulmonary arterial infusion catheter approximately 9 cm peripheral to the expected outflow of the main pulmonary artery. As such, the right pulmonary arterial catheter's infusion wire was removed and the multi side-hole infusion catheter was retracted  to the estimated location of the main pulmonary artery. Pressure measurements were acquired from this location. At this point, the procedure was terminated. All wires, catheters and sheaths were removed from the patient. Hemostasis was achieved at the right groin access site with manual compression. A dressing was placed. The patient tolerated the procedure well without immediate postprocedural complication. FINDINGS: Acquired pressure measurements as follows: Preprocedural main pulmonary artery-53/17; mean - 27 (normal: < 25/10) Postprocedural main pulmonary artery-48/22; mean - 31 IMPRESSION: Technically successful completion of bilateral catheter directed pulmonary arterial thrombolysis with subjective improvement in work of breathing and slight reduction in peak systolic pressure within the main pulmonary artery. Electronically Signed   By: Sandi Mariscal M.D.   On: 08/16/2020 09:56    Cardiac Studies   Echocardiogram 08/14/2020 Limited:   1. Left ventricular ejection fraction, by estimation, is 65 to 70%. The  left ventricle has normal function. Left ventricular diastolic parameters  were normal.  2. Right ventricular systolic function is moderately reduced. The right  ventricular size is mildly enlarged.  3. The aortic valve is grossly normal. Mild aortic valve sclerosis is  present, with no evidence of aortic valve stenosis.   Patient Profile     70 y.o. male  post COVID saddle pulmonary embolism directed TPA lysis with moderately reduced right ventricular function prior to lysis.  Assessment & Plan    Massive pulmonary emboli saddle embolism -Had syncope, evidence of significant right heart dysfunction and elevated troponin in the setting of saddle pulmonary emboli status post catheter directed thrombolytic infusion with interventional radiology, Dr. Sandi Mariscal. -Right heart numbers slightly improved immediately post procedure, breathing improved as well. -Over the next several days to weeks, should see improvement in overall right ventricular size and function now that loading factors have changed.  Chronic anticoagulation -Apixaban.  Not only for PE but also for atrial flutter which was likely secondary to large pulmonary embolism letter  Atrial flutter -Typical atrial flutter noted with variable conduction currently 4-1.  Does have some periods of lengthier AV block approximately 2 seconds.  Continue with current treatment plan.  Would not advocate for cardioversion at this time given his overall stability with this rhythm.  Could consider in the future, perhaps 3 to 4 weeks after continuous Eliquis.  Spoke to he and his wife at length.  Questions answered. Okay for transfer out of 2H      For questions or updates, please contact [place] Please consult [email or web address] for contact info under        Signed, Candee Furbish, MD  08/17/2020, 8:58 AM

## 2020-08-17 NOTE — TOC Benefit Eligibility Note (Signed)
Transition of Care [facility]) Benefit Eligibility Note    Patient Details  Name: Bobby Fields MRN: [id number] Date of Birth: 1950-12-26   Medication/Dose: Eliquis 5mg . bid for 30 day supply  Covered?: Yes  Tier:  (?)  Prescription Coverage Preferred Pharmacy: [email or web address]  Spoke with Person/Company/Phone Number:: Rhonetta T. W/[employer] [facility] [place] PH# [phone]  Co-Pay: $47.00  Prior Approval: No  Deductible: Unmet       Shelda Altes Phone Number: 08/17/2020, 10:23 AM

## 2020-08-18 ENCOUNTER — Other Ambulatory Visit (HOSPITAL_COMMUNITY): Payer: Self-pay | Admitting: Student

## 2020-08-18 DIAGNOSIS — N179 Acute kidney failure, unspecified: Secondary | ICD-10-CM | POA: Diagnosis not present

## 2020-08-18 DIAGNOSIS — I2699 Other pulmonary embolism without acute cor pulmonale: Secondary | ICD-10-CM | POA: Diagnosis not present

## 2020-08-18 DIAGNOSIS — I4892 Unspecified atrial flutter: Secondary | ICD-10-CM | POA: Diagnosis not present

## 2020-08-18 DIAGNOSIS — E119 Type 2 diabetes mellitus without complications: Secondary | ICD-10-CM | POA: Diagnosis not present

## 2020-08-18 DIAGNOSIS — I483 Typical atrial flutter: Secondary | ICD-10-CM | POA: Diagnosis not present

## 2020-08-18 LAB — BASIC METABOLIC PANEL
Anion gap: 12 (ref 5–15)
BUN: 14 mg/dL (ref 8–23)
CO2: 22 mmol/L (ref 22–32)
Calcium: 8.7 mg/dL — ABNORMAL LOW (ref 8.9–10.3)
Chloride: 101 mmol/L (ref 98–111)
Creatinine, Ser: 1.4 mg/dL — ABNORMAL HIGH (ref 0.61–1.24)
GFR, Estimated: 54 mL/min — ABNORMAL LOW (ref >60)
Glucose, Bld: 122 mg/dL — ABNORMAL HIGH (ref 70–99)
Potassium: 3.8 mmol/L (ref 3.5–5.1)
Sodium: 135 mmol/L (ref 135–145)

## 2020-08-18 LAB — CBC
HCT: 44.2 % (ref 39.0–52.0)
Hemoglobin: 15.1 g/dL (ref 13.0–17.0)
MCH: 28.7 pg (ref 26.0–34.0)
MCHC: 34.2 g/dL (ref 30.0–36.0)
MCV: 84 fL (ref 80.0–100.0)
Platelets: 168 10*3/uL (ref 150–400)
RBC: 5.26 MIL/uL (ref 4.22–5.81)
RDW: 13.6 % (ref 11.5–15.5)
WBC: 8 10*3/uL (ref 4.0–10.5)
nRBC: 0 % (ref 0.0–0.2)

## 2020-08-18 LAB — GLUCOSE, CAPILLARY
Glucose-Capillary: 169 mg/dL — ABNORMAL HIGH (ref 70–99)
Glucose-Capillary: 162 mg/dL — ABNORMAL HIGH (ref 70–99)

## 2020-08-18 MED ORDER — APIXABAN 5 MG PO TABS: 10.0000 mg | ORAL_TABLET | Freq: Two times a day (BID) | ORAL | 0 refills | Status: DC

## 2020-08-18 MED ORDER — INSULIN ASPART 100 UNIT/ML ~~LOC~~ SOLN
0.0000 [IU] | Freq: Three times a day (TID) | SUBCUTANEOUS | Status: DC
  Administered 2020-08-18 (×2): 3 [IU] via SUBCUTANEOUS

## 2020-08-18 MED ORDER — APIXABAN 5 MG PO TABS: 5.0000 mg | ORAL_TABLET | Freq: Two times a day (BID) | ORAL | 2 refills | Status: DC

## 2020-08-18 MED FILL — ELIQUIS 5 MG TABLET: 5 | 30 days supply | Qty: 74 | Fill #0

## 2020-08-18 NOTE — Progress Notes (Signed)
Progress Note  Patient Name: Bobby Fields Date of Encounter: 08/18/2020  [facility] Cardiologist: Werner Lean, MD   Subjective   Doing well, sitting up in chair.  Feeling ready to go home.  Inpatient Medications    Scheduled Meds: . apixaban  10 mg Oral BID   Followed by  . [START ON 08/24/2020] apixaban  5 mg Oral BID  . Chlorhexidine Gluconate Cloth  6 each Topical Daily  . ezetimibe  10 mg Oral Daily  . insulin aspart  0-15 Units Subcutaneous TID WC  . insulin aspart  0-5 Units Subcutaneous QHS  . insulin glargine  15 Units Subcutaneous QHS  . pantoprazole  40 mg Oral Daily  . pravastatin  20 mg Oral Q Sun  . sodium chloride flush  3 mL Intravenous Q12H  . sodium chloride flush  3 mL Intravenous Q12H   Continuous Infusions: . sodium chloride Stopped (08/16/20 0917)  . sodium chloride    . sodium chloride    . sodium chloride Stopped (08/16/20 0916)  . sodium chloride Stopped (08/16/20 0916)  . sodium chloride Stopped (08/16/20 0916)   PRN Meds: sodium chloride, sodium chloride, acetaminophen, ondansetron (ZOFRAN) IV, sodium chloride flush, sodium chloride flush   Vital Signs    Vitals:   08/17/20 2018 08/17/20 2232 08/18/20 0453 08/18/20 0741  BP: 138/90 (!) 142/90 (!) 132/93 137/89  Pulse: 80 79 83 80  Resp: 18 19 20 20   Temp: 98 F (36.7 C) 98.3 F (36.8 C) 98.3 F (36.8 C) 97.6 F (36.4 C)  TempSrc: Oral Oral Oral Oral  SpO2: 98% 99% 100% 95%  Weight:      Height:        Intake/Output Summary (Last 24 hours) at 08/18/2020 1048 Last data filed at 08/18/2020 0600 Gross per 24 hour  Intake 480 ml  Output 675 ml  Net -195 ml   Last 3 Weights 08/16/2020 08/15/2020 08/14/2020  Weight (lbs) 205 lb 0.4 oz 202 lb 4.8 oz 202 lb 3.2 oz  Weight (kg) 93 kg 91.763 kg 91.717 kg      Telemetry    Atrial flutter with overall good rate control- Personally Reviewed  ECG    Typical atrial flutter from 08/14/2020- Personally Reviewed  Physical Exam    GEN: No acute distress.   Neck: No JVD Cardiac: RRR, no murmurs, rubs, or gallops, occasional ectopy.  Respiratory: Clear to auscultation bilaterally. GI: Soft, nontender, non-distended  MS: No edema; No deformity. Neuro:  Nonfocal  Psych: Normal affect   Labs    High Sensitivity Troponin:   Recent Labs  Lab 08/13/20 1549 08/13/20 1737 08/13/20 2130 08/14/20 0609 08/14/20 1030  TROPONINIHS 59* 156* 260* 245* 158*      Chemistry Recent Labs  Lab 08/16/20 0430 08/17/20 0006 08/18/20 0109  NA 136 135 135  K 4.1 3.8 3.8  CL 104 103 101  CO2 21* 20* 22  GLUCOSE 96 136* 122*  BUN 14 12 14   CREATININE 1.31* 1.47* 1.40*  CALCIUM 8.3* 8.6* 8.7*  GFRNONAA 59* 51* 54*  ANIONGAP 11 12 12      Hematology Recent Labs  Lab 08/16/20 1103 08/17/20 0006 08/18/20 0109  WBC 9.6 8.6 8.0  RBC 5.26 5.37 5.26  HGB 14.5 14.9 15.1  HCT 45.3 46.1 44.2  MCV 86.1 85.8 84.0  MCH 27.6 27.7 28.7  MCHC 32.0 32.3 34.2  RDW 13.4 13.4 13.6  PLT 121* 128* 168    BNPNo results for input(s): BNP, PROBNP  in the last 168 hours.   DDimer  Recent Labs  Lab 08/14/20 1640  DDIMER 14.27*     Radiology    No results found.  Cardiac Studies   Echo this admit EF 70% with moderately reduced right ventricular function  Patient Profile     70 y.o. male post COVID with saddle pulmonary embolism directed TPA lysis with moderately reduced right ventricular function at that time with atrial flutter  Assessment & Plan    Massive pulmonary embolism, saddle -He had a syncopal event preceding.  Right heart dysfunction.  TPA directed.  Dr. Sandi Mariscal with interventional radiology.  Right heart numbers were improved. -Over the next several days we should see improvement in his right ventricular function.  Chronic anticoagulation -Apixaban.  Continue for at least 6 months.  If his atrial flutter becomes more permanent, may need lifelong anticoagulation.  Atypical flutter -Mostly 4-1  pattern with good rate control.  Continue with current treatment plan.  At this point, we will hold off on cardioversion given his recent massive PE.  His rhythm is well controlled from a rate perspective.  I think we should consider cardioversion in the future may be in 3 to 4 weeks after continuous Eliquis.  Of course if rate control becomes an issue, we can always proceed with TEE cardioversion.  At this time, I be comfortable with discharge with close follow-up, 2 weeks with me or my team at the request of Mr. Spiker and wife. We will help facilitate.     For questions or updates, please contact [place] Please consult [email or web address] for contact info under        Signed, Candee Furbish, MD  08/18/2020, 10:48 AM

## 2020-08-18 NOTE — Care Management Important Message (Signed)
Important Message  Patient Details  Name: Bobby Fields MRN: [id number] Date of Birth: October 06, 1950   Medicare Important Message Given:  Yes Due to Staffing  Issues attempted to call patient in room.  NO answer IM mailed to the patient home address.     Langley Flatley 08/18/2020, 1:48 PM

## 2020-08-18 NOTE — Progress Notes (Signed)
CARDIOLOGY OFFICE NOTE  Date:  09/01/2020    [name] Date of Birth: 1951/02/18 Medical Record #[id number]  PCP:  Nicoletta Dress, MD  Cardiologist:  Gasper Sells (NEW)  Chief Complaint  Patient presents with  . Follow-up    Seen for Dr. Gasper Sells    History of Present Illness: Bobby Fields is a 70 y.o. male who presents today for a post hospital visit. Seen for Dr. Gasper Sells (NEW).   He has a history of HTN, HLD, DM, COVID infection ~1 mo prior to admission. No known CAD.   Admitted earlier this month with syncope - found to be in [place] yesterday with initial thoughts that this was symptomatic atrial flutter. Admitted to PCU where subsequent workup revealed elevated D dimer and CTA chest showed saddle PE with R heart strain/reduced RV function. Denied any further presyncope.  Has intermittent L chest pleurisy.  Lingering dry cough after COVID, no hemoptysis.  No personal or family history of blood clots. No recent surgeries, no history of stroke or bleeding issues.  Has had  ulcerative colitis and is s/p colectomy ~9 years ago with J pouch.    He was treated with TPA. Then placed on Eliquis. Rate was controlled. To consider outpatient cardioversion in a few weeks. Will need follow up echo to recheck right heart strain.   To see Dr. Quentin Ore in mid March.   Comes in today. Here with his wife. Saw his PCP - last week. BMET checked - can see off the [place]. Walking 3 times a day. No chest pain. Not short of breath. No cough. Feels good. Not dizzy. Tolerating his medicines well. Asking about long term plan. No palpitations noted.   Past Medical History:  Diagnosis Date  . Diabetes mellitus without complication ([place])   . High cholesterol   . Hypertension     Past Surgical History:  Procedure Laterality Date  . IR ANGIOGRAM PULMONARY BILATERAL SELECTIVE  08/15/2020  . IR ANGIOGRAM SELECTIVE EACH ADDITIONAL VESSEL  08/15/2020  . IR ANGIOGRAM SELECTIVE EACH  ADDITIONAL VESSEL  08/15/2020  . IR INFUSION THROMBOL ARTERIAL INITIAL (MS)  08/15/2020  . IR INFUSION THROMBOL ARTERIAL INITIAL (MS)  08/15/2020  . IR THROMB F/U EVAL ART/VEN FINAL DAY (MS)  08/16/2020     Medications: Current Meds  Medication Sig  . apixaban (ELIQUIS) 5 MG TABS tablet Take 1 tablet (5 mg total) by mouth 2 (two) times daily.  . Calcium Carb-Cholecalciferol 600-800 MG-UNIT TABS Take 1 tablet by mouth 2 (two) times daily.  Marland Kitchen ezetimibe (ZETIA) 10 MG tablet Take 10 mg by mouth daily.  Marland Kitchen glimepiride (AMARYL) 4 MG tablet Take 4 mg by mouth daily with breakfast.  . JANUVIA 100 MG tablet Take 100 mg by mouth daily.  Marland Kitchen lisinopril (ZESTRIL) 5 MG tablet Take 5 mg by mouth at bedtime.  Marland Kitchen omeprazole (PRILOSEC) 20 MG capsule Take 20 mg by mouth daily.  . pravastatin (PRAVACHOL) 20 MG tablet Take 20 mg by mouth once a week. Sundays  . TRESIBA FLEXTOUCH 100 UNIT/ML FlexTouch Pen Inject 20 Units into the skin at bedtime.     Allergies: Allergies  Allergen Reactions  . Indomethacin Diarrhea    Other reaction(s): Other (See Comments) "caused ulcerative colitis;bleeding ulcers"     Social History: The patient  reports that he has never smoked. He has never used smokeless tobacco. He reports that he does not drink alcohol.   Family History: The patient's family history is not on file.  Review of Systems: Please see the history of present illness.   All other systems are reviewed and negative.   Physical Exam: VS:  BP 120/76   Pulse 77   Ht 5\' 11"  (1.803 m)   Wt 203 lb 1.9 oz (92.1 kg)   BMI 28.33 kg/m  .  BMI Body mass index is 28.33 kg/m.  Wt Readings from Last 3 Encounters:  09/01/20 203 lb 1.9 oz (92.1 kg)  08/16/20 205 lb 0.4 oz (93 kg)    General: Pleasant. Well developed, well nourished and in no acute distress.   Cardiac: Regular rate and rhythm. No murmurs, rubs, or gallops. No edema.  Respiratory:  Lungs are clear to auscultation bilaterally with normal work of  breathing.  GI: Soft and nontender.  MS: No deformity or atrophy. Gait and ROM intact.  Skin: Warm and dry. Color is normal.  Neuro:  Strength and sensation are intact and no gross focal deficits noted.  Psych: Alert, appropriate and with normal affect.   LABORATORY DATA:  EKG:  EKG is ordered today.  Personally reviewed by me. This demonstrates atrial flutter with variable block - Hr is 77.  Lab Results  Component Value Date   WBC 8.0 08/18/2020   HGB 15.1 08/18/2020   HCT 44.2 08/18/2020   PLT 168 08/18/2020   GLUCOSE 122 (H) 08/18/2020   NA 135 08/18/2020   K 3.8 08/18/2020   CL 101 08/18/2020   CREATININE 1.40 (H) 08/18/2020   BUN 14 08/18/2020   CO2 22 08/18/2020   TSH 0.505 08/14/2020   HGBA1C 7.5 (H) 08/14/2020        BNP (last 3 results) No results for input(s): BNP in the last 8760 hours.  ProBNP (last 3 results) No results for input(s): PROBNP in the last 8760 hours.   Other Studies Reviewed Today:  CT ANGIO CHEST PE W OR WO CONTRAST  IMPRESSION: Extensive bilateral pulmonary emboli including saddle embolus. CT evidence of right heart strain (RV/LV Ratio = 1.6) consistent with at least submassive (intermediate risk) PE. The presence of right heart strain has been associated with an increased risk of morbidity and mortality. Small scattered nodules in the right lower lobe, 4 mm or less. No follow-up needed if patient is low-risk (and has no known or suspected primary neoplasm). Non-contrast chest CT can be considered in 12 months if patient is high-risk. This recommendation follows the consensus statement: Guidelines for Management of Incidental Pulmonary Nodules Detected on CT Images: From the Fleischner Society 2017; Radiology 2017; 284:228-243. Critical Value/emergent results were called by telephone at the time of interpretation on 08/14/2020 at 9:57 pm to provider Dr. Koleen Distance, who verbally acknowledged these results. Electronically Signed   By: Rolm Baptise  M.D.   On: 08/14/2020 22:19    ECHOCARDIOGRAM LIMITED Result Date: 08/14/2020 IMPRESSIONS  1. Left ventricular ejection fraction, by estimation, is 65 to 70%. The left ventricle has normal function. Left ventricular diastolic parameters were normal.  2. Right ventricular systolic function is moderately reduced. The right ventricular size is mildly enlarged.  3. The aortic valve is grossly normal. Mild aortic valve sclerosis is present, with no evidence of aortic valve stenosis.     Assessment & Plan    1. Persistent atrial flutter - on Eliquis now - consider cardioversion on return - he has EP follow up planned for next month. His rate is controlled. He is not symptomatic.   2. Massive saddle PE - on Eliquis - had right heart  dysfunction - treated with TPA - now on Eliquis - will get limited echo next month to follow up.   3. Prior syncope - has not recurred.   4. Chronic anticoagulation - to be on for at least 6 months - if has recurrent atrial flutter - would favor long term therapy - he is aware.   5. Prior COVID 19 illness - felt to be the precipitating event for his PE.   6. Mild CKD - recheck next month.   7. DM - per PCP.    Current medicines are reviewed with the patient today.  The patient does not have concerns regarding medicines other than what has been noted above.  The following changes have been made:  See above.  Labs/ tests ordered today include:    Orders Placed This Encounter  Procedures  . EKG 12-Lead     Disposition:   FU with Dr. Quentin Ore as planned in March. Then can determine follow up with Dr. Gasper Sells. No change with medicines today.  Will try to get a limited echo in March as well.   Patient is agreeable to this plan and will call if any problems develop in the interim.   SignedTruitt Merle, NP  09/01/2020 10:51 AM  [place] [address] [place], [place]  [address] Phone: [phone] Fax:  [phone]

## 2020-08-18 NOTE — Plan of Care (Signed)

## 2020-08-18 NOTE — TOC Transition Note (Signed)
Transition of Care [facility]) - CM/SW Discharge Note   Patient Details  Name: Sion Reinders MRN: [id number] Date of Birth: 02/06/1951  [facility] of Care [facility]) CM/SW Contact:  Zenon Mayo, RN Phone Number: 08/18/2020, 1:44 PM   Clinical Narrative:    Patient is for dc today, NCM spoke with wife , informed her about the eliquis co pay for 47.00 .    Final next level of care: Home/Self Care Barriers to Discharge: No Barriers Identified   Patient Goals and CMS Choice Patient states their goals for this hospitalization and ongoing recovery are:: go home      Discharge Placement                       Discharge Plan and Services                  DME Agency: NA                  Social Determinants of Health (SDOH) Interventions     Readmission Risk Interventions No flowsheet data found.

## 2020-08-18 NOTE — Progress Notes (Signed)
Pt provide with detail verbal discharge instructions. Wife at beside during d/c. RN answered all questions VSS at d/c. IV removed. Prescriptions brought to bedside by [facility] pharmacy.  Patient belongings sent with patient. Patient d/c via wheelchair to [employer] entrance by NT to private vehicle.

## 2020-08-21 ENCOUNTER — Telehealth (HOSPITAL_COMMUNITY): Payer: Self-pay | Admitting: Pharmacist

## 2020-08-21 NOTE — Telephone Encounter (Signed)
Pharmacy Transitions of Care Follow-up Telephone Call  Date of discharge: 08/18/20 Discharge Diagnosis: PE  How have you been since you were released from the hospital? Well   Medication changes made at discharge:  Start Eliquis  Medication changes obtained and verified? Yes    Medication Accessibility:  Home Pharmacy: [facility]  Was the patient provided with refills on discharged medications? Yes   Have all prescriptions been transferred from [facility] to home pharmacy? Yes   Is the patient able to afford medications? Yes . Notable copays: $47/mo . Eligible patient assistance: Notified pt that if it becomes unaffordable he can speak with cardiologist about applying for patient assistance    Medication Review:  APIXABEN (ELIQUIS)  Apixaban 10 mg BID initiated on 08/16/20. Will switch to apixaban 5 mg BID after 7 days (08/24/20).  - Discussed importance of taking medication around the same time everyday   Pt reports he is taking approx 12 hours apart - Reviewed potential DDIs with patient  - Advised patient of medications to avoid (NSAIDs, ASA)  - Educated that Tylenol (acetaminophen) will be the preferred analgesic to prevent risk of bleeding  - Emphasized importance of monitoring for signs and symptoms of bleeding (abnormal bruising, prolonged bleeding, nose bleeds, bleeding from gums, discolored urine, black tarry stools)  - Advised patient to alert all providers of anticoagulation therapy prior to starting a new medication or having a procedure    Follow-up Appointments:  PCP Hospital f/u appt confirmed - Scheduled to see Dr. Nelda Bucks on 08/25/20.   [facility] f/u appt confirmed - Scheduled to see cardiology, Truitt Merle on 09/01/20 @ 10:15.   If their condition worsens, is the pt aware to call PCP or go to the Emergency Dept.? Yes  Final Patient Assessment: Pt feels well and reports he is taking medication correctly.  He has a great understanding  of new med, Eliquis.  He is also aware of all follow-up visits.  It was a pleasure speaking with this patient.

## 2020-08-25 DIAGNOSIS — R55 Syncope and collapse: Secondary | ICD-10-CM | POA: Diagnosis not present

## 2020-08-25 DIAGNOSIS — I4892 Unspecified atrial flutter: Secondary | ICD-10-CM | POA: Diagnosis not present

## 2020-08-25 DIAGNOSIS — N179 Acute kidney failure, unspecified: Secondary | ICD-10-CM | POA: Diagnosis not present

## 2020-08-25 DIAGNOSIS — N189 Chronic kidney disease, unspecified: Secondary | ICD-10-CM | POA: Diagnosis not present

## 2020-08-25 DIAGNOSIS — I2692 Saddle embolus of pulmonary artery without acute cor pulmonale: Secondary | ICD-10-CM | POA: Diagnosis not present

## 2020-09-01 ENCOUNTER — Encounter: Payer: Self-pay | Admitting: Nurse Practitioner

## 2020-09-01 ENCOUNTER — Other Ambulatory Visit: Payer: Self-pay

## 2020-09-01 ENCOUNTER — Ambulatory Visit (INDEPENDENT_AMBULATORY_CARE_PROVIDER_SITE_OTHER): Payer: Medicare HMO | Admitting: Nurse Practitioner

## 2020-09-01 VITALS — BP 120/76 | HR 77 | Ht 71.0 in | Wt 203.1 lb

## 2020-09-01 DIAGNOSIS — I2692 Saddle embolus of pulmonary artery without acute cor pulmonale: Secondary | ICD-10-CM

## 2020-09-01 DIAGNOSIS — Z79899 Other long term (current) drug therapy: Secondary | ICD-10-CM

## 2020-09-01 DIAGNOSIS — Q208 Other congenital malformations of cardiac chambers and connections: Secondary | ICD-10-CM | POA: Diagnosis not present

## 2020-09-01 DIAGNOSIS — I4892 Unspecified atrial flutter: Secondary | ICD-10-CM | POA: Diagnosis not present

## 2020-09-01 DIAGNOSIS — U071 COVID-19: Secondary | ICD-10-CM

## 2020-09-01 NOTE — Patient Instructions (Addendum)
After Visit Summary:  We will be checking the following labs today - NONE   Medication Instructions:    Continue with your current medicines.    If you need a refill on your cardiac medications before your next appointment, please call your pharmacy.     Testing/Procedures To Be Arranged:  Lets try to get a limited echo to follow up on your heart on the day you see Dr. Quentin Ore  Follow-Up:   See Dr. Quentin Ore as planned  Follow up with Dr. Gasper Sells will be determined after that.     At [facility], you and your health needs are our priority.  As part of our continuing mission to provide you with exceptional heart care, we have created designated Provider Care Teams.  These Care Teams include your primary Cardiologist (physician) and Advanced Practice Providers (APPs -  Physician Assistants and Nurse Practitioners) who all work together to provide you with the care you need, when you need it.  Special Instructions:  . Stay safe, wash your hands for at least 20 seconds and wear a mask when needed.  . It was good to talk with you both today.    Call the [place] office at [phone] if you have any questions, problems or concerns.

## 2020-09-20 ENCOUNTER — Encounter (HOSPITAL_COMMUNITY): Payer: Self-pay | Admitting: Emergency Medicine

## 2020-09-20 ENCOUNTER — Emergency Department (HOSPITAL_COMMUNITY): Payer: Medicare HMO

## 2020-09-20 ENCOUNTER — Emergency Department (HOSPITAL_COMMUNITY)
Admission: EM | Admit: 2020-09-20 | Discharge: 2020-09-21 | Disposition: A | Discharge status: Home / Self Care | Payer: Medicare HMO | Attending: Emergency Medicine | Admitting: Emergency Medicine

## 2020-09-20 ENCOUNTER — Other Ambulatory Visit: Payer: Self-pay

## 2020-09-20 ENCOUNTER — Telehealth: Payer: Self-pay | Admitting: Physician Assistant

## 2020-09-20 DIAGNOSIS — I4892 Unspecified atrial flutter: Secondary | ICD-10-CM | POA: Diagnosis not present

## 2020-09-20 DIAGNOSIS — R911 Solitary pulmonary nodule: Secondary | ICD-10-CM | POA: Diagnosis not present

## 2020-09-20 DIAGNOSIS — Z86711 Personal history of pulmonary embolism: Secondary | ICD-10-CM | POA: Diagnosis not present

## 2020-09-20 DIAGNOSIS — R079 Chest pain, unspecified: Secondary | ICD-10-CM | POA: Diagnosis not present

## 2020-09-20 DIAGNOSIS — I2699 Other pulmonary embolism without acute cor pulmonale: Secondary | ICD-10-CM | POA: Diagnosis not present

## 2020-09-20 DIAGNOSIS — I10 Essential (primary) hypertension: Secondary | ICD-10-CM | POA: Insufficient documentation

## 2020-09-20 DIAGNOSIS — E119 Type 2 diabetes mellitus without complications: Secondary | ICD-10-CM | POA: Insufficient documentation

## 2020-09-20 DIAGNOSIS — Z7984 Long term (current) use of oral hypoglycemic drugs: Secondary | ICD-10-CM | POA: Insufficient documentation

## 2020-09-20 DIAGNOSIS — Z7901 Long term (current) use of anticoagulants: Secondary | ICD-10-CM | POA: Insufficient documentation

## 2020-09-20 DIAGNOSIS — R0789 Other chest pain: Secondary | ICD-10-CM | POA: Diagnosis not present

## 2020-09-20 DIAGNOSIS — M47814 Spondylosis without myelopathy or radiculopathy, thoracic region: Secondary | ICD-10-CM | POA: Diagnosis not present

## 2020-09-20 HISTORY — DX: Unspecified atrial flutter: I48.92

## 2020-09-20 LAB — CBC
HCT: 51 % (ref 39.0–52.0)
Hemoglobin: 15.7 g/dL (ref 13.0–17.0)
MCH: 27.4 pg (ref 26.0–34.0)
MCHC: 30.8 g/dL (ref 30.0–36.0)
MCV: 89.2 fL (ref 80.0–100.0)
Platelets: 214 10*3/uL (ref 150–400)
RBC: 5.72 MIL/uL (ref 4.22–5.81)
RDW: 13.5 % (ref 11.5–15.5)
WBC: 7.2 10*3/uL (ref 4.0–10.5)
nRBC: 0 % (ref 0.0–0.2)

## 2020-09-20 LAB — BASIC METABOLIC PANEL
Anion gap: 7 (ref 5–15)
BUN: 18 mg/dL (ref 8–23)
CO2: 27 mmol/L (ref 22–32)
Calcium: 9.4 mg/dL (ref 8.9–10.3)
Chloride: 103 mmol/L (ref 98–111)
Creatinine, Ser: 1.39 mg/dL — ABNORMAL HIGH (ref 0.61–1.24)
GFR, Estimated: 55 mL/min — ABNORMAL LOW (ref >60)
Glucose, Bld: 140 mg/dL — ABNORMAL HIGH (ref 70–99)
Potassium: 4.2 mmol/L (ref 3.5–5.1)
Sodium: 137 mmol/L (ref 135–145)

## 2020-09-20 LAB — TROPONIN I (HIGH SENSITIVITY)
Troponin I (High Sensitivity): 10 ng/L (ref <18)
Troponin I (High Sensitivity): 8 ng/L (ref <18)

## 2020-09-20 MED ORDER — IOHEXOL 350 MG/ML SOLN
80.0000 mL | Freq: Once | INTRAVENOUS | Status: AC | PRN
  Administered 2020-09-20: 80 mL via INTRAVENOUS

## 2020-09-20 NOTE — ED Triage Notes (Signed)
Pt reports L sided chest burning and pain under L arm since 6am today.  Took 4 baby ASA and pain resolved.  Reports PE 5 weeks ago.  States he is scheduled to see cardiologist on Tuesday.  Denies any associated symptoms.

## 2020-09-20 NOTE — ED Provider Notes (Signed)
[facility] Provider Note   CSN: [id number] Arrival date & time: 09/20/20  1730     History Chief Complaint  Patient presents with  . Chest Pain    Bobby Fields is a 70 y.o. male with PMhx HTN, high cholesterol, diabetes, a flutter, and recent diagnosis of submassive PE (on eliquis) who presents to the ED today via personal vehicle with complaint of sudden onset, intermittent, both sharp as well as stinging/burning sensation of left sided chest pain that began this morning upon waking up at 6 AM.  Patient reports he woke up around 6 AM this morning with a sharp stabbing sensation to his left axilla.  He states that the sensation will last a couple of seconds and then go away however he had about 2-3 episodes that were about a couple of minutes apart.  He drove to [facility] to visit with his family and states that throughout the day he had similar intermittent pain more to the left anterior aspect of his chest that now felt like a stinging and burning sensation.  He called his cardiologist who recommended he take 4 baby aspirin and chew them which he did, states he had 1 more episode of the same stinging sensation around 2 PM however has not had any since.  He was advised that he needs to go to the closest emergency department however decided to drive back to [place] to come here.  She denies any associated symptoms including diaphoresis, shortness of breath, nausea, vomiting, lightheadedness, dizziness.  He has an appointment scheduled for Tuesday to see his cardiologist as well as plan for ablation for his new atrial flutter.   He was recently admitted to the hospital from 2/03-2/08 presenting for a near syncopal episode.  He was found to be in new atrial flutter with RVR.  While in the hospital he was also found to have a submassive PE, had Covid in January.  Reports he had been doing well until this morning.  Denies history of heart attacks for himself  personally however reports his father died at the age of 44 from a heart attack in his brother had a heart attack at the age of 66.  Patient is a never smoker.  The history is provided by the patient and medical records.       Past Medical History:  Diagnosis Date  . Atrial flutter ([place])   . Diabetes mellitus without complication ([place])   . High cholesterol   . Hypertension     Patient Active Problem List   Diagnosis Date Noted  . Atrial flutter ([place]) 08/13/2020    Past Surgical History:  Procedure Laterality Date  . IR ANGIOGRAM PULMONARY BILATERAL SELECTIVE  08/15/2020  . IR ANGIOGRAM SELECTIVE EACH ADDITIONAL VESSEL  08/15/2020  . IR ANGIOGRAM SELECTIVE EACH ADDITIONAL VESSEL  08/15/2020  . IR INFUSION THROMBOL ARTERIAL INITIAL (MS)  08/15/2020  . IR INFUSION THROMBOL ARTERIAL INITIAL (MS)  08/15/2020  . IR THROMB F/U EVAL ART/VEN FINAL DAY (MS)  08/16/2020       No family history on file.  Social History   Tobacco Use  . Smoking status: Never Smoker  . Smokeless tobacco: Never Used  Substance Use Topics  . Alcohol use: Never    Home Medications Prior to Admission medications   Medication Sig Start Date End Date Taking? Authorizing Provider  apixaban (ELIQUIS) 5 MG TABS tablet Take 1 tablet (5 mg total) by mouth 2 (two) times daily. 08/24/20 11/22/20  [name], DO  Calcium Carb-Cholecalciferol 600-800 MG-UNIT TABS Take 1 tablet by mouth 2 (two) times daily.    [provider]  ezetimibe (ZETIA) 10 MG tablet Take 10 mg by mouth daily. 07/07/20   [provider]  glimepiride (AMARYL) 4 MG tablet Take 4 mg by mouth daily with breakfast.    [provider]  JANUVIA 100 MG tablet Take 100 mg by mouth daily. 07/16/20   [provider]  lisinopril (ZESTRIL) 5 MG tablet Take 5 mg by mouth at bedtime. 07/07/20   [provider]  omeprazole (PRILOSEC) 20 MG capsule Take 20 mg by mouth daily. 07/07/20   [provider]  pravastatin  (PRAVACHOL) 20 MG tablet Take 20 mg by mouth once a week. Sundays 07/07/20   [provider]  TRESIBA FLEXTOUCH 100 UNIT/ML FlexTouch Pen Inject 20 Units into the skin at bedtime. 07/07/20   [provider]    Allergies    Indomethacin  Review of Systems   Review of Systems  Constitutional: Negative for chills, diaphoresis and fever.  Respiratory: Negative for shortness of breath.   Cardiovascular: Positive for chest pain. Negative for palpitations and leg swelling.  Gastrointestinal: Negative for nausea and vomiting.  All other systems reviewed and are negative.   Physical Exam Updated Vital Signs BP (!) 145/90 (BP Location: Left Arm)   Pulse 77   Temp 98.1 F (36.7 C)   Resp 16   SpO2 94%   Physical Exam Vitals and nursing note reviewed.  Constitutional:      Appearance: He is not ill-appearing or diaphoretic.  HENT:     Head: Normocephalic and atraumatic.  Eyes:     Conjunctiva/sclera: Conjunctivae normal.  Cardiovascular:     Rate and Rhythm: Normal rate and regular rhythm.     Pulses:          Radial pulses are 2+ on the right side and 2+ on the left side.       Dorsalis pedis pulses are 2+ on the right side and 2+ on the left side.     Heart sounds: Normal heart sounds.  Pulmonary:     Effort: Pulmonary effort is normal.     Breath sounds: Normal breath sounds. No decreased breath sounds, wheezing, rhonchi or rales.  Chest:     Chest wall: Tenderness present.     Comments: No overlying skin changes to chest wall or left axilla; mild TTP to the left anterior chest wall Abdominal:     Palpations: Abdomen is soft.     Tenderness: There is no abdominal tenderness.  Musculoskeletal:     Cervical back: Neck supple.     Right lower leg: No edema.     Left lower leg: No edema.  Skin:    General: Skin is warm and dry.  Neurological:     Mental Status: He is alert.     ED Results / Procedures / Treatments   Labs (all labs ordered are listed,  but only abnormal results are displayed) Labs Reviewed  BASIC METABOLIC PANEL - Abnormal; Notable for the following components:      Result Value   Glucose, Bld 140 (*)    Creatinine, Ser 1.39 (*)    GFR, Estimated 55 (*)    All other components within normal limits  CBC  TROPONIN I (HIGH SENSITIVITY)  TROPONIN I (HIGH SENSITIVITY)    EKG EKG Interpretation  Date/Time:  Sunday September 20 2020 17:36:22 EDT Ventricular Rate:  78 PR Interval:    QRS Duration: 70 QT Interval:  382 QTC Calculation: 435 R Axis:   85 Text Interpretation: Atrial flutter with 4:1 A-V conduction Nonspecific ST abnormality Abnormal ECG No significant change since last tracing Confirmed by Isla Pence [id number]) on 09/20/2020 6:09:46 PM   Radiology DG Chest 2 View  Result Date: 09/20/2020 CLINICAL DATA:  Left-sided chest burning and pain under the left arm. EXAM: CHEST - 2 VIEW COMPARISON:  August 16, 2020 FINDINGS: Mildly decreased lung volumes are seen likely secondary to the degree of patient inspiration. There is no evidence of acute infiltrate, pleural effusion or pneumothorax. The heart size and mediastinal contours are within normal limits. The pulmonary artery lysis catheter seen on the prior study has been removed. Degenerative changes are seen within the thoracic spine. IMPRESSION: No active cardiopulmonary disease. Electronically Signed   By: Virgina Norfolk M.D.   On: 09/20/2020 18:25   CT Angio Chest PE W and/or Wo Contrast  Result Date: 09/20/2020 CLINICAL DATA:  Left-sided chest burning, left arm pain, history of recent pulmonary embolus EXAM: CT ANGIOGRAPHY CHEST WITH CONTRAST TECHNIQUE: Multidetector CT imaging of the chest was performed using the standard protocol during bolus administration of intravenous contrast. Multiplanar CT image reconstructions and MIPs were obtained to evaluate the vascular anatomy. CONTRAST:  33mL OMNIPAQUE IOHEXOL 350 MG/ML SOLN COMPARISON:  08/14/2020 FINDINGS:  Cardiovascular: This is a technically adequate evaluation of the pulmonary vasculature. There are no acute pulmonary emboli. There has been complete resorption of the bilateral pulmonary emboli and saddle embolus seen previously. The heart is unremarkable without pericardial effusion. Normal caliber of the thoracic aorta. Stable atherosclerosis of the coronary vasculature. Mediastinum/Nodes: No enlarged mediastinal, hilar, or axillary lymph nodes. Thyroid gland, trachea, and esophagus demonstrate no significant findings. Lungs/Pleura: No acute airspace disease, effusion, or pneumothorax. Right lower lobe subpleural pulmonary nodules are again identified and stable. Nodules are as follows: Right lower lobe, image 74/6, 4 mm. Right lower lobe, image 79/6, 6 mm. Right lower lobe, image 82/6, 4 mm. Upper Abdomen: No acute abnormality. Musculoskeletal: No acute or destructive bony lesions. Reconstructed images demonstrate no additional findings. Review of the MIP images confirms the above findings. IMPRESSION: 1. No acute pulmonary emboli. Complete resorption of the bilateral pulmonary emboli seen previously. 2. Stable subpleural right lower lobe pulmonary nodules, largest 6 mm on today's study unchanged since prior exam by my measurement. Non-contrast chest CT at 3-6 months is recommended. If the nodules are stable at time of repeat CT, then future CT at 18-24 months (from today's scan) is considered optional for low-risk patients, but is recommended for high-risk patients. This recommendation follows the consensus statement: Guidelines for Management of Incidental Pulmonary Nodules Detected on CT Images: From the Fleischner Society 2017; Radiology 2017; 284:228-243. 3.  Aortic Atherosclerosis (ICD10-I70.0). Electronically Signed   By: Randa Ngo M.D.   On: 09/20/2020 22:19    Procedures Procedures   Medications Ordered in ED Medications  iohexol (OMNIPAQUE) 350 MG/ML injection 80 mL (80 mLs Intravenous  Contrast Given 09/20/20 2210)    ED Course  I have reviewed the triage vital signs and the nursing notes.  Pertinent labs & imaging results that were available during my care of the patient were reviewed by me and considered in my medical decision making (see chart for details).    MDM Rules/Calculators/A&P  70 year old male who presents to the ED today with complaint of intermittent left-sided chest pain that began around 6 AM this morning, approximately 15-20 episodes since then that it lasted several seconds.  He was recently admitted to the hospital after presenting for near syncope, found to be in new a flutter with RVR as well as a submassive PE likely from Covid in January.  Is planning to see cardiology on Tuesday for ablation for a flutter as well as echo.  Arrival to the ED vitals are stable.  Patient is afebrile, nontachycardic nontachypneic.  He has not had any chest pain since around 2 PM today shortly after taking 4 baby aspirin advised by cardiology.  No other symptoms.  We will plan to work-up for ACS at this time.  Patient has been compliant with his Eliquis for PE.  Describes the pain as a stinging/burning sensation.  No overlying skin changes to suggest shingles at this time however pt mentions his daughter recently had shingles; he has been vaccinated.   EKG with a flutter with a rate of 4:1.  CXR clear CBC without leukocytosis. Hgb stable at 15.7 BMP with creatinine 1.39; unchanged from baseline. No other electrolyte abnormalities Troponin of 10; will plan to repeat  Attending physician Dr. Gilford Raid has evaluated patient as well; will add on CTA at this time to rule out new PE causing pain.   Repeat troponin of 8 CTA negative for PE  Have discussed case with cardiologist Dr. Ailene Ravel who will come evaluate patient. Appreciate his involvement.   At shift change case signed out to oncoming provider Bobby Munroe, PA-C, who will await cards  reccomendations and dispo accordingly.   This note was prepared using Dragon voice recognition software and may include unintentional dictation errors due to the inherent limitations of voice recognition software.  Final Clinical Impression(s) / ED Diagnoses Final diagnoses:  Nonspecific chest pain    Rx / DC Orders ED Discharge Orders    None       Eustaquio Maize, PA-C 09/20/20 Juana Di­az, Julie, MD 09/23/20 [phone]

## 2020-09-20 NOTE — ED Notes (Signed)
Pt taken to x-ray. X-ray will bring pt to room when finished.

## 2020-09-20 NOTE — ED Notes (Signed)
The pt has had lt upper chest pain all day  Becoming more severe  This afternoon at present no pain.  He describes  The pain as a burning sensation.

## 2020-09-20 NOTE — Telephone Encounter (Signed)
Pt called with burning chest pain in his left chest. He has never felt this CP before. It woke him from sleep this morning around 0600. CP has been intermittent. He is about 1 hr from [place]. I advised stopping at the nearest ER. I also advised to take 324 mg ASA in route. He states he wants to try to make it to [facility]. I again expressed that they should stop at the nearest emergency room. He and his wife expressed understanding of the plan.   Tami Lin Duke, PA-C 09/20/2020, 3:50 PM

## 2020-09-20 NOTE — Discharge Instructions (Signed)
Follow up with your cardiologist as scheduled on Tuesday for further evaluation

## 2020-09-21 DIAGNOSIS — R079 Chest pain, unspecified: Secondary | ICD-10-CM | POA: Insufficient documentation

## 2020-09-21 NOTE — Consult Note (Signed)
Cardiology Consultation:   Patient ID: Tranquilino Fischler MRN: [id number]; DOB: 05-06-1951  Admit date: 09/20/2020 Date of Consult: 09/21/2020  Primary Care Provider: Nicoletta Dress, MD [facility] Cardiologist: Candee Furbish, MD  [facility] Electrophysiologist:  None   Patient Profile:   70M with HTN, HLD, DM2, AFL, and recent saddle PE in the setting of COVID who presents with atypical chest pain.   History of Present Illness:   Mr. Perkins reports acute onset left lateral pectoral pain which started at 0600 this morning with brief 1 to 2-second episodes which occurred 3-4 times throughout the morning.  The pain was nonradiating and caused him to have to catch his breath and abruptly stopped without any change.  He was recently admitted for syncope and found to have a saddle pulmonary embolus for which she was treated with catheter directed thrombolysis and subsequently placed on Eliquis.  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  He had left-sided pleuritic pain during his prior hospitalization.  He is a non-smoker with moderately well-controlled blood pressure.  He had multiple recurrent episodes of similar pain throughout the day all of which lasted no more than 2 seconds.  None of which were exertion and they would occur randomly throughout the day.  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  He is not symptomatic from the atrial flutter but is trying to wait until cardioversion to be more active so that his rates do not become uncontrolled.  His family history is significant for coronary disease in both his father who passed of a massive heart attack at age 17 as well as his brother who had early onset CAD with MI at 73 resolved.  Both his father and brother were significant smokers.  His mother also had heart disease and had bypass surgery in her 41s.  During my discussion with him he was asymptomatic and had not had  episodes of recurrent chest discomfort in several hours.  Past Medical History:  Diagnosis Date  . Atrial flutter ([place])   . Diabetes mellitus without complication ([place])   . High cholesterol   . Hypertension    Past Surgical History:  Procedure Laterality Date  . IR ANGIOGRAM PULMONARY BILATERAL SELECTIVE  08/15/2020  . IR ANGIOGRAM SELECTIVE EACH ADDITIONAL VESSEL  08/15/2020  . IR ANGIOGRAM SELECTIVE EACH ADDITIONAL VESSEL  08/15/2020  . IR INFUSION THROMBOL ARTERIAL INITIAL (MS)  08/15/2020  . IR INFUSION THROMBOL ARTERIAL INITIAL (MS)  08/15/2020  . IR THROMB F/U EVAL ART/VEN FINAL DAY (MS)  08/16/2020    Home Medications:  Prior to Admission medications   Medication Sig Start Date End Date Taking? Authorizing Provider  apixaban (ELIQUIS) 5 MG TABS tablet Take 1 tablet (5 mg total) by mouth 2 (two) times daily. 08/24/20 11/22/20  [name], DO  Calcium Carb-Cholecalciferol 600-800 MG-UNIT TABS Take 1 tablet by mouth 2 (two) times daily.    [provider]  ezetimibe (ZETIA) 10 MG tablet Take 10 mg by mouth daily. 07/07/20   [provider]  glimepiride (AMARYL) 4 MG tablet Take 4 mg by mouth daily with breakfast.    [provider]  JANUVIA 100 MG tablet Take 100 mg by mouth daily. 07/16/20   [provider]  lisinopril (ZESTRIL) 5 MG tablet Take 5 mg by mouth at bedtime. 07/07/20   [provider]  omeprazole (PRILOSEC) 20 MG capsule Take 20 mg by mouth daily.  07/07/20   [provider]  pravastatin (PRAVACHOL) 20 MG tablet Take 20 mg by mouth once a week. Sundays 07/07/20   [provider]  TRESIBA FLEXTOUCH 100 UNIT/ML FlexTouch Pen Inject 20 Units into the skin at bedtime. 07/07/20   [provider]    Inpatient Medications: Scheduled Meds:  Continuous Infusions:  PRN Meds:   Allergies:    Allergies  Allergen Reactions  . Indomethacin Diarrhea    Other reaction(s): Other (See Comments) "caused ulcerative  colitis;bleeding ulcers"     Social History:   Social History   Socioeconomic History  . Marital status: Married    Spouse name: Not on file  . Number of children: Not on file  . Years of education: Not on file  . Highest education level: Not on file  Occupational History  . Not on file  Tobacco Use  . Smoking status: Never Smoker  . Smokeless tobacco: Never Used  Substance and Sexual Activity  . Alcohol use: Never  . Drug use: Not on file  . Sexual activity: Not on file  Other Topics Concern  . Not on file  Social History Narrative  . Not on file   Social Determinants of Health   Financial Resource Strain: Not on file  Food Insecurity: Not on file  Transportation Needs: Not on file  Physical Activity: Not on file  Stress: Not on file  Social Connections: Not on file  Intimate Partner Violence: Not on file    Family History:   *No family history on file.   ROS:  Review of Systems: [y] = yes, [ ]  = no       General: Weight gain [ ] ; Weight loss [ ] ; Anorexia [ ] ; Fatigue [ ] ; Fever [ ] ; Chills [ ] ; Weakness [ ]     Cardiac: Chest pain/pressure [y]; Resting SOB [ ] ; Exertional SOB [ ] ; Orthopnea [ ] ; Pedal Edema [ ] ; Palpitations [ ] ; Syncope [ ] ; Presyncope [ ] ; Paroxysmal nocturnal dyspnea [ ]     Pulmonary: Cough [ ] ; Wheezing [ ] ; Hemoptysis [ ] ; Sputum [ ] ; Snoring [ ]     GI: Vomiting [ ] ; Dysphagia [ ] ; Melena [ ] ; Hematochezia [ ] ; Heartburn [ ] ; Abdominal pain [ ] ; Constipation [ ] ; Diarrhea [ ] ; BRBPR [ ]     GU: Hematuria [ ] ; Dysuria [ ] ; Nocturia [ ]   Vascular: Pain in legs with walking [ ] ; Pain in feet with lying flat [ ] ; Non-healing sores [ ] ; Stroke [ ] ; TIA [ ] ; Slurred speech [ ] ;    Neuro: Headaches [ ] ; Vertigo [ ] ; Seizures [ ] ; Paresthesias [ ] ;Blurred vision [ ] ; Diplopia [ ] ; Vision changes [ ]     Ortho/Skin: Arthritis [ ] ; Joint pain [ ] ; Muscle pain [ ] ; Joint swelling [ ] ; Back Pain [ ] ; Rash [ ]     Psych: Depression [ ] ; Anxiety [ ]      Heme: Bleeding problems [ ] ; Clotting disorders [ ] ; Anemia [ ]     Endocrine: Diabetes [ ] ; Thyroid dysfunction [ ]    Physical Exam/Data:   Vitals:   09/20/20 1900 09/20/20 1930 09/20/20 2000 09/20/20 2215  BP: (!) 138/92 (!) 137/94    Pulse: 79 76 74 73  Resp: 20 (!) 25 (!) 21 (!) 23  Temp:      SpO2: 96% 95% 97% 93%   No intake or output data in the 24 hours ending 09/21/20 0008 Last 3 Weights 09/01/2020  08/16/2020 08/15/2020  Weight (lbs) 203 lb 1.9 oz 205 lb 0.4 oz 202 lb 4.8 oz  Weight (kg) 92.135 kg 93 kg 91.763 kg     There is no height or weight on file to calculate BMI.  General:  Well nourished, well developed, in no acute distress HEENT: normal Lymph: no adenopathy Neck: no JVD Endocrine:  No thryomegaly Vascular: No carotid bruits; FA pulses 2+ bilaterally without bruits  Cardiac:  normal S1, S2; RRR; no murmur  Lungs:  clear to auscultation bilaterally, no wheezing, rhonchi or rales  Abd: soft, nontender, no hepatomegaly  Ext: no edema Musculoskeletal:  No deformities, BUE and BLE strength normal and equal Skin: warm and dry  Neuro:  CNs 2-12 intact, no focal abnormalities noted Psych:  Normal affect   EKG:  The EKG was personally reviewed and demonstrates: rate controlled AFL, no ischemic changes   Relevant CV Studies:  TTE Result date: 08/14/20 1. Left ventricular ejection fraction, by estimation, is 65 to 70%. The  left ventricle has normal function. Left ventricular diastolic parameters  were normal.  2. Right ventricular systolic function is moderately reduced. The right  ventricular size is mildly enlarged.  3. The aortic valve is grossly normal. Mild aortic valve sclerosis is  present, with no evidence of aortic valve stenosis.   Laboratory Data:  High Sensitivity Troponin:   Recent Labs  Lab 09/20/20 1743 09/20/20 1956  TROPONINIHS 10 8     Chemistry Recent Labs  Lab 09/20/20 1743  NA 137  K 4.2  CL 103  CO2 27  GLUCOSE 140*   BUN 18  CREATININE 1.39*  CALCIUM 9.4  GFRNONAA 55*  ANIONGAP 7    No results for input(s): PROT, ALBUMIN, AST, ALT, ALKPHOS, BILITOT in the last 168 hours. Hematology Recent Labs  Lab 09/20/20 1743  WBC 7.2  RBC 5.72  HGB 15.7  HCT 51.0  MCV 89.2  MCH 27.4  MCHC 30.8  RDW 13.5  PLT 214   BNPNo results for input(s): BNP, PROBNP in the last 168 hours.  DDimer No results for input(s): DDIMER in the last 168 hours.  Radiology/Studies:  DG Chest 2 View  Result Date: 09/20/2020 CLINICAL DATA:  Left-sided chest burning and pain under the left arm. EXAM: CHEST - 2 VIEW COMPARISON:  August 16, 2020 FINDINGS: Mildly decreased lung volumes are seen likely secondary to the degree of patient inspiration. There is no evidence of acute infiltrate, pleural effusion or pneumothorax. The heart size and mediastinal contours are within normal limits. The pulmonary artery lysis catheter seen on the prior study has been removed. Degenerative changes are seen within the thoracic spine. IMPRESSION: No active cardiopulmonary disease. Electronically Signed   By: Virgina Norfolk M.D.   On: 09/20/2020 18:25   CT Angio Chest PE W and/or Wo Contrast  Result Date: 09/20/2020 CLINICAL DATA:  Left-sided chest burning, left arm pain, history of recent pulmonary embolus EXAM: CT ANGIOGRAPHY CHEST WITH CONTRAST TECHNIQUE: Multidetector CT imaging of the chest was performed using the standard protocol during bolus administration of intravenous contrast. Multiplanar CT image reconstructions and MIPs were obtained to evaluate the vascular anatomy. CONTRAST:  73mL OMNIPAQUE IOHEXOL 350 MG/ML SOLN COMPARISON:  08/14/2020 FINDINGS: Cardiovascular: This is a technically adequate evaluation of the pulmonary vasculature. There are no acute pulmonary emboli. There has been complete resorption of the bilateral pulmonary emboli and saddle embolus seen previously. The heart is unremarkable without pericardial effusion. Normal  caliber of the thoracic aorta. Stable  atherosclerosis of the coronary vasculature. Mediastinum/Nodes: No enlarged mediastinal, hilar, or axillary lymph nodes. Thyroid gland, trachea, and esophagus demonstrate no significant findings. Lungs/Pleura: No acute airspace disease, effusion, or pneumothorax. Right lower lobe subpleural pulmonary nodules are again identified and stable. Nodules are as follows: Right lower lobe, image 74/6, 4 mm. Right lower lobe, image 79/6, 6 mm. Right lower lobe, image 82/6, 4 mm. Upper Abdomen: No acute abnormality. Musculoskeletal: No acute or destructive bony lesions. Reconstructed images demonstrate no additional findings. Review of the MIP images confirms the above findings. IMPRESSION: 1. No acute pulmonary emboli. Complete resorption of the bilateral pulmonary emboli seen previously. 2. Stable subpleural right lower lobe pulmonary nodules, largest 6 mm on today's study unchanged since prior exam by my measurement. Non-contrast chest CT at 3-6 months is recommended. If the nodules are stable at time of repeat CT, then future CT at 18-24 months (from today's scan) is considered optional for low-risk patients, but is recommended for high-risk patients. This recommendation follows the consensus statement: Guidelines for Management of Incidental Pulmonary Nodules Detected on CT Images: From the Fleischner Society 2017; Radiology 2017; 284:228-243. 3.  Aortic Atherosclerosis (ICD10-I70.0). Electronically Signed   By: Randa Ngo M.D.   On: 09/20/2020 22:19   HEART score (4) for age, RFs (HTN, DM2) and family hx (brother with premature CAD).   Assessment and Plan:   53M with HTN, HLD, DM2, AFL, and recent saddle PE in the setting of COVID who presents with atypical chest pain.  His ECG was consistent with known recently diagnosed rate control AFL.  His troponins were within the normal limits and significant delta.  All his other lab work was reassuring.  We discussed different  management options including expedited stress testing versus general cardiology follow-up for reassessment of symptoms.  His story is fairly consistent with pleuritic chest pain following his large PE.  Fortunately the PE has resolved following catheter directed thrombolysis [place].  The short duration of the first chest discomfort at his left lateral wall are similar to that which he experienced during hospitalization but a much more frequent duration.  His troponins were elevated during his last admission when he had his saddle.  Fortunately there back to baseline now.  He and his wife are agreeable to discharge with close follow-up with general cardiology.  We discussed that with negative cardiac biomarkers and ECG alone there is no way to definitively rule out obstructive coronary disease risk for SCD without additional risk stratification however he is exam, labwork, diagnostic testing up to this point are reassuring.  He is comfortable discharging home and I will facilitate general cardiology follow-up.  He will keep his scheduled appointment with electrophysiology for management of AFL.  Return precautions given symptoms are persistent, worsening, or longer duration to immediately return to the ED and/or call 911.  For questions or updates, please contact [place] Please consult [email or web address] for contact info under   Signed, Dion Body, MD  09/21/2020 12:08 AM

## 2020-09-21 NOTE — ED Provider Notes (Signed)
12:49 AM Patient seen by and cleared by cardiology.  Will discharge with close cards follow-up.   Montine Circle, PA-C 59/97/74 1423    Delora Fuel, MD 95/32/02 0330

## 2020-09-22 ENCOUNTER — Other Ambulatory Visit: Payer: Self-pay

## 2020-09-22 ENCOUNTER — Other Ambulatory Visit: Payer: Self-pay | Admitting: *Deleted

## 2020-09-22 ENCOUNTER — Ambulatory Visit (HOSPITAL_COMMUNITY): Payer: Medicare HMO | Attending: Cardiovascular Disease

## 2020-09-22 ENCOUNTER — Ambulatory Visit (INDEPENDENT_AMBULATORY_CARE_PROVIDER_SITE_OTHER): Payer: Medicare HMO | Admitting: Cardiology

## 2020-09-22 ENCOUNTER — Encounter: Payer: Self-pay | Admitting: Cardiology

## 2020-09-22 VITALS — BP 132/86 | HR 77 | Ht 71.0 in | Wt 200.0 lb

## 2020-09-22 DIAGNOSIS — I4892 Unspecified atrial flutter: Secondary | ICD-10-CM | POA: Insufficient documentation

## 2020-09-22 DIAGNOSIS — Q208 Other congenital malformations of cardiac chambers and connections: Secondary | ICD-10-CM | POA: Diagnosis not present

## 2020-09-22 DIAGNOSIS — I2692 Saddle embolus of pulmonary artery without acute cor pulmonale: Secondary | ICD-10-CM | POA: Diagnosis not present

## 2020-09-22 DIAGNOSIS — Z01812 Encounter for preprocedural laboratory examination: Secondary | ICD-10-CM | POA: Diagnosis not present

## 2020-09-22 DIAGNOSIS — I483 Typical atrial flutter: Secondary | ICD-10-CM

## 2020-09-22 DIAGNOSIS — Z79899 Other long term (current) drug therapy: Secondary | ICD-10-CM | POA: Insufficient documentation

## 2020-09-22 DIAGNOSIS — U071 COVID-19: Secondary | ICD-10-CM | POA: Diagnosis not present

## 2020-09-22 LAB — ECHOCARDIOGRAM LIMITED
Area-P 1/2: 5.38 cm2
S' Lateral: 3.4 cm

## 2020-09-22 MED ORDER — PERFLUTREN LIPID MICROSPHERE
1.0000 mL | INTRAVENOUS | Status: AC | PRN
  Administered 2020-09-22: 1 mL via INTRAVENOUS

## 2020-09-22 NOTE — Patient Instructions (Signed)
Medication Instructions:  Your physician recommends that you continue on your current medications as directed. Please refer to the Current Medication list given to you today.  *If you need a refill on your cardiac medications before your next appointment, please call your pharmacy*   Lab Work: Stop by the [place] office on 4/29, prior to your covid screening.  You do NOT need to be fasting.  If you have labs (blood work) drawn today and your tests are completely normal, you will receive your results only by: Marland Kitchen MyChart Message (if you have MyChart) OR . A paper copy in the mail If you have any lab test that is abnormal or we need to change your treatment, we will call you to review the results.   Testing/Procedures: Your physician has recommended that you have an ablation. Catheter ablation is a medical procedure used to treat some cardiac arrhythmias (irregular heartbeats). During catheter ablation, a long, thin, flexible tube is put into a blood vessel in your groin (upper thigh), or neck. This tube is called an ablation catheter. It is then guided to your heart through the blood vessel. Radio frequency waves destroy small areas of heart tissue where abnormal heartbeats may cause an arrhythmia to start. Please see the instructions below located under "other instructions".    Follow-Up: At [facility], you and your health needs are our priority.  As part of our continuing mission to provide you with exceptional heart care, we have created designated Provider Care Teams.  These Care Teams include your primary Cardiologist (physician) and Advanced Practice Providers (APPs -  Physician Assistants and Nurse Practitioners) who all work together to provide you with the care you need, when you need it.  Your next appointment:   4 week(s) after your ablation  The format for your next appointment:   In Person  Provider:   Lars Mage, MD    Thank you for choosing [facility]!!   [phone]   Other Instructions    Electrophysiology/Ablation Procedure Instructions   You are scheduled for a(n) AFlutter ablation on 11/09/2020 with Dr. Allegra Lai.   1.   Pre procedure testing-             A.  LAB WORK --- On 4/29  for your pre procedure blood work - prior to your Covid testing.  You do NOT need to be fasting.  You can stop by the [facility] office.                B. COVID TEST-- On 11/06/2020 @ 9:30 am - This is a Drive Up Visit at [address] [place], [place] [address].  Someone will direct you to the appropriate testing line. Stay in your car and someone will be with you shortly.   After you are tested please go home and self quarantine until the day of your procedure.     PROCEDURE DAY: 2. On the day of your procedure 11/09/2020 you will go to [facility] [phone]) at 5:30 am.  Dennis Bast will go to the main entrance A [employer]) and enter where the [employer] are.  Your driver will drop you off and you will head down the hallway to ADMITTING.  You may have one support person come in to the hospital with you.  They will be asked to wait in the waiting room.  It is OK to have someone drop you off and come back when you are ready  to be discharged.   3.   Do not eat or drink after midnight prior to your procedure.   4.   Do not miss any doses of your blood thinner prior to the morning of your procedure or your procedure will need to be rescheduled.       Do NOT take any medications the morning of your procedure.   5.  Plan for an overnight stay, but you may be discharged home after your procedure. If you use your phone frequently bring your phone charger, in case you have to stay.  If you are discharged after your procedure you will need someone to drive you home and be with your for 24 hours after your procedure.   6. You will follow up with the AFIB clinic 4 weeks after your procedure.  You will follow up with Dr. Quentin Ore  3 months after your procedure.  These appointments will be made for you.   * If you have ANY questions please call the office [phone] and ask for [facility] or send me a MyChart message   * Occasionally, EP Studies and ablations can become lengthy.  Please make your family aware of this before your procedure starts.  Average time ranges from 2-8 hours for EP studies/ablations.  Your physician will call your family after the procedure with the results.                                   Cardiac Ablation Cardiac ablation is a procedure to destroy (ablate) some heart tissue that is sending bad signals. These bad signals cause problems in heart rhythm. The heart has many areas that make these signals. If there are problems in these areas, they can make the heart beat in a way that is not normal. Destroying some tissues can help make the heart rhythm normal. Tell your doctor about:  Any allergies you have.  All medicines you are taking. These include vitamins, herbs, eye drops, creams, and over-the-counter medicines.  Any problems you or family members have had with medicines that make you fall asleep (anesthetics).  Any blood disorders you have.  Any surgeries you have had.  Any medical conditions you have, such as kidney failure.  Whether you are pregnant or may be pregnant. What are the risks? This is a safe procedure. But problems may occur, including:  Infection.  Bruising and bleeding.  Bleeding into the chest.  Stroke or blood clots.  Damage to nearby areas of your body.  Allergies to medicines or dyes.  The need for a pacemaker if the normal system is damaged.  Failure of the procedure to treat the problem. What happens before the procedure? Medicines Ask your doctor about:  Changing or stopping your normal medicines. This is important.  Taking aspirin and ibuprofen. Do not take these medicines unless your doctor tells you to take them.  Taking other  medicines, vitamins, herbs, and supplements. General instructions  Follow instructions from your doctor about what you cannot eat or drink.  Plan to have someone take you home from the hospital or clinic.  If you will be going home right after the procedure, plan to have someone with you for 24 hours.  Ask your doctor what steps will be taken to prevent infection. What happens during the procedure?  An IV tube will be put into one of your veins.  You will be given a  medicine to help you relax.  The skin on your neck or groin will be numbed.  A cut (incision) will be made in your neck or groin. A needle will be put through your cut and into a large vein.  A tube (catheter) will be put into the needle. The tube will be moved to your heart.  Dye may be put through the tube. This helps your doctor see your heart.  Small devices (electrodes) on the tube will send out signals.  A type of energy will be used to destroy some heart tissue.  The tube will be taken out.  Pressure will be held on your cut. This helps stop bleeding.  A bandage will be put over your cut. The exact procedure may vary among doctors and hospitals.   What happens after the procedure?  You will be watched until you leave the hospital or clinic. This includes checking your heart rate, breathing rate, oxygen, and blood pressure.  Your cut will be watched for bleeding. You will need to lie still for a few hours.  Do not drive for 24 hours or as long as your doctor tells you. Summary  Cardiac ablation is a procedure to destroy some heart tissue. This is done to treat heart rhythm problems.  Tell your doctor about any medical conditions you may have. Tell him or her about all medicines you are taking to treat them.  This is a safe procedure. But problems may occur. These include infection, bruising, bleeding, and damage to nearby areas of your body.  Follow what your doctor tells you about food and drink.  You may also be told to change or stop some of your medicines.  After the procedure, do not drive for 24 hours or as long as your doctor tells you. This information is not intended to replace advice given to you by your health care provider. Make sure you discuss any questions you have with your health care provider. Document Revised: 05/30/2019 Document Reviewed: 05/30/2019 Elsevier Patient Education  2021 [employer].

## 2020-09-22 NOTE — Progress Notes (Signed)
Electrophysiology Office Follow up Visit Note:    Date:  09/22/2020   ID:  Bobby Fields, DOB 19-Nov-1950, MRN [id number]  PCP:  Nicoletta Dress, MD  [facility] Cardiologist:  Candee Furbish, MD  [facility] Electrophysiologist:  Vickie Epley, MD    Interval History:    [place] Bobby Fields is a 70 y.o. male who presents for a follow up visit.  I first met the patient August 15, 2020 when he was hospitalized for atrial flutter and found to have a submassive PE/saddle embolus.  He underwent catheter directed thrombolytics.  He is in clinic with his wife today.  Since leaving the hospital he has been doing well.  He is tolerating the Eliquis without bleeding issues.  He is back walking 3 times a day for 15 minutes each session without difficulty.  No lightheadedness or dizziness or syncope.     Past Medical History:  Diagnosis Date  . Atrial flutter ([place])   . Diabetes mellitus without complication ([place])   . High cholesterol   . Hypertension     Past Surgical History:  Procedure Laterality Date  . IR ANGIOGRAM PULMONARY BILATERAL SELECTIVE  08/15/2020  . IR ANGIOGRAM SELECTIVE EACH ADDITIONAL VESSEL  08/15/2020  . IR ANGIOGRAM SELECTIVE EACH ADDITIONAL VESSEL  08/15/2020  . IR INFUSION THROMBOL ARTERIAL INITIAL (MS)  08/15/2020  . IR INFUSION THROMBOL ARTERIAL INITIAL (MS)  08/15/2020  . IR THROMB F/U EVAL ART/VEN FINAL DAY (MS)  08/16/2020    Current Medications: Current Meds  Medication Sig  . apixaban (ELIQUIS) 5 MG TABS tablet Take 1 tablet (5 mg total) by mouth 2 (two) times daily.  . Calcium Carb-Cholecalciferol 600-800 MG-UNIT TABS Take 1 tablet by mouth 2 (two) times daily.  Marland Kitchen ezetimibe (ZETIA) 10 MG tablet Take 10 mg by mouth daily.  Marland Kitchen glimepiride (AMARYL) 4 MG tablet Take 4 mg by mouth daily with breakfast.  . JANUVIA 100 MG tablet Take 100 mg by mouth daily.  Marland Kitchen lisinopril (ZESTRIL) 5 MG tablet Take 5 mg by mouth at bedtime.  Marland Kitchen omeprazole (PRILOSEC) 20 MG capsule Take 20  mg by mouth daily.  . pravastatin (PRAVACHOL) 20 MG tablet Take 20 mg by mouth once a week. Sundays  . TRESIBA FLEXTOUCH 100 UNIT/ML FlexTouch Pen Inject 20 Units into the skin at bedtime.     Allergies:   Indomethacin   Social History   Socioeconomic History  . Marital status: Married    Spouse name: Not on file  . Number of children: Not on file  . Years of education: Not on file  . Highest education level: Not on file  Occupational History  . Not on file  Tobacco Use  . Smoking status: Never Smoker  . Smokeless tobacco: Never Used  Substance and Sexual Activity  . Alcohol use: Never  . Drug use: Not on file  . Sexual activity: Not on file  Other Topics Concern  . Not on file  Social History Narrative  . Not on file   Social Determinants of Health   Financial Resource Strain: Not on file  Food Insecurity: Not on file  Transportation Needs: Not on file  Physical Activity: Not on file  Stress: Not on file  Social Connections: Not on file     Family History: The patient's family history is not on file.  ROS:   Please see the history of present illness.    All other systems reviewed and are negative.  EKGs/Labs/Other Studies Reviewed:  The following studies were reviewed today:  September 22, 2020 echo personally reviewed LV function normal RV size appears normal on my read although I am awaiting the formal read. RV function appears mildly reduced  EKG:  The ekg from September 21, 2020 shows typical atrial flutter with variable AV conduction with a ventricular rate of 78 bpm    Recent Labs: 08/14/2020: TSH 0.505 08/15/2020: Magnesium 2.0 09/20/2020: BUN 18; Creatinine, Ser 1.39; Hemoglobin 15.7; Platelets 214; Potassium 4.2; Sodium 137  Recent Lipid Panel No results found for: CHOL, TRIG, HDL, CHOLHDL, VLDL, LDLCALC, LDLDIRECT  Physical Exam:    VS:  BP 132/86   Pulse 77   Ht _0  (1.803 m)   Wt 200 lb (90.7 kg)   SpO2 97%   BMI 27.89 kg/m     Wt  Readings from Last 3 Encounters:  09/22/20 200 lb (90.7 kg)  09/01/20 203 lb 1.9 oz (92.1 kg)  08/16/20 205 lb 0.4 oz (93 kg)     GEN:  Well nourished, well developed in no acute distress HEENT: Normal NECK: No JVD; No carotid bruits LYMPHATICS: No lymphadenopathy CARDIAC: Irregularly irregular, no murmurs, rubs, gallops RESPIRATORY:  Clear to auscultation without rales, wheezing or rhonchi  ABDOMEN: Soft, non-tender, non-distended MUSCULOSKELETAL:  No edema; No deformity  SKIN: Warm and dry NEUROLOGIC:  Alert and oriented x 3 PSYCHIATRIC:  Normal affect   ASSESSMENT:    1. Typical atrial flutter ([place])   2. Acute saddle pulmonary embolism, unspecified whether acute cor pulmonale present (HCC)    PLAN:    In order of problems listed above:  1. Typical atrial flutter In the setting of RV dysfunction in the setting of an acute pulmonary embolus.  Patient is now post catheter directed thrombolytics.  He remains in rate controlled atrial flutter.  Ideally, would plan to pursue rhythm control for Mr. Bobby Fields.  Discussed treatment options including cardioversion, antiarrhythmic therapy and ablation.  In an effort to obtain durable results, plan to pursue ablation of his typical atrial flutter.  Would like to wait at least 6-8 more weeks and schedule a procedure for early May to allow him several months of anticoagulation after his pulmonary embolus last month.  I discussed the procedure in detail with the patient including the risks, expected recovery.  I specifically discussed the possibility of uncovering atrial fibrillation after a successful atrial flutter ablation.  I would plan to implant a loop recorder after 3 months from the flutter ablation.  Risk, benefits, and alternatives to EP study and radiofrequency ablation for AFL were also discussed in detail today. These risks include but are not limited to stroke, bleeding, vascular damage, tamponade, perforation, damage to the esophagus,  lungs, and other structures, pulmonary vein stenosis, worsening renal function, and death. The patient understands these risk and wishes to proceed.  We will therefore proceed with catheter ablation at the next available time.  Carto, ICE, anesthesia are requested for the procedure.     2.  Acute PE Post catheter directed thrombolytics Continue Eliquis uninterrupted   Medication Adjustments/Labs and Tests Ordered: Current medicines are reviewed at length with the patient today.  Concerns regarding medicines are outlined above.  No orders of the defined types were placed in this encounter.  No orders of the defined types were placed in this encounter.    Signed, Lars Mage, MD, [facility]  09/22/2020 8:35 AM    Electrophysiology [place]

## 2020-11-04 ENCOUNTER — Telehealth: Payer: Self-pay | Admitting: Cardiology

## 2020-11-04 NOTE — Telephone Encounter (Signed)
Returned call to Pt.  Advised he needs to continue to take his Eliquis as ordered EXCEPT the morning of his procedure.  He states he was told to take 1/2 his normal amount of insulin night before procedure.  This is not in his instruction letter.  He states his normal morning blood sugar runs 115-130.    Advised he could take his  Normal amount of insulin night before his procedure.    Pt thanked nurse for call back.

## 2020-11-04 NOTE — Telephone Encounter (Signed)
Bobby Fields is calling with some questions in regards to his upcoming ablation. He also wants to know if his Eliquis needs to be held prior. He states he would like the callback from [place]. Please advise.

## 2020-11-06 ENCOUNTER — Other Ambulatory Visit (HOSPITAL_COMMUNITY)
Admission: RE | Admit: 2020-11-06 | Discharge: 2020-11-06 | Disposition: A | Discharge status: Home / Self Care | Payer: Medicare HMO | Source: Ambulatory Visit | Attending: Cardiology | Admitting: Cardiology

## 2020-11-06 ENCOUNTER — Other Ambulatory Visit (HOSPITAL_COMMUNITY): Payer: Medicare HMO

## 2020-11-06 DIAGNOSIS — Z20822 Contact with and (suspected) exposure to covid-19: Secondary | ICD-10-CM | POA: Diagnosis not present

## 2020-11-06 DIAGNOSIS — Z01812 Encounter for preprocedural laboratory examination: Secondary | ICD-10-CM | POA: Diagnosis not present

## 2020-11-06 DIAGNOSIS — I483 Typical atrial flutter: Secondary | ICD-10-CM | POA: Diagnosis not present

## 2020-11-06 LAB — CBC
Hematocrit: 47 % (ref 37.5–51.0)
Hemoglobin: 15.2 g/dL (ref 13.0–17.7)
MCH: 27.1 pg (ref 26.6–33.0)
MCHC: 32.3 g/dL (ref 31.5–35.7)
MCV: 84 fL (ref 79–97)
Platelets: 225 10*3/uL (ref 150–450)
RBC: 5.61 x10E6/uL (ref 4.14–5.80)
RDW: 13.4 % (ref 11.6–15.4)
WBC: 5.9 10*3/uL (ref 3.4–10.8)

## 2020-11-06 LAB — BASIC METABOLIC PANEL
BUN/Creatinine Ratio: 16 (ref 10–24)
BUN: 23 mg/dL (ref 8–27)
CO2: 22 mmol/L (ref 20–29)
Calcium: 9.2 mg/dL (ref 8.6–10.2)
Chloride: 105 mmol/L (ref 96–106)
Creatinine, Ser: 1.42 mg/dL — ABNORMAL HIGH (ref 0.76–1.27)
Glucose: 120 mg/dL — ABNORMAL HIGH (ref 65–99)
Potassium: 4.6 mmol/L (ref 3.5–5.2)
Sodium: 143 mmol/L (ref 134–144)
eGFR: 53 mL/min/{1.73_m2} — ABNORMAL LOW (ref >59)

## 2020-11-06 LAB — SARS CORONAVIRUS 2 (TAT 6-24 HRS): SARS Coronavirus 2: NEGATIVE

## 2020-11-09 ENCOUNTER — Ambulatory Visit (HOSPITAL_COMMUNITY): Payer: Medicare HMO | Admitting: Anesthesiology

## 2020-11-09 ENCOUNTER — Encounter (HOSPITAL_COMMUNITY)
Admission: RE | Disposition: A | Discharge status: Home / Self Care | Payer: Self-pay | Source: Home / Self Care | Attending: Cardiology

## 2020-11-09 ENCOUNTER — Other Ambulatory Visit: Payer: Self-pay

## 2020-11-09 ENCOUNTER — Encounter (HOSPITAL_COMMUNITY): Payer: Self-pay | Admitting: Cardiology

## 2020-11-09 ENCOUNTER — Ambulatory Visit (HOSPITAL_COMMUNITY)
Admission: RE | Admit: 2020-11-09 | Discharge: 2020-11-09 | Disposition: A | Discharge status: Home / Self Care | Payer: Medicare HMO | Attending: Cardiology | Admitting: Cardiology

## 2020-11-09 DIAGNOSIS — I483 Typical atrial flutter: Secondary | ICD-10-CM | POA: Insufficient documentation

## 2020-11-09 DIAGNOSIS — E119 Type 2 diabetes mellitus without complications: Secondary | ICD-10-CM | POA: Insufficient documentation

## 2020-11-09 DIAGNOSIS — I4892 Unspecified atrial flutter: Secondary | ICD-10-CM | POA: Diagnosis not present

## 2020-11-09 DIAGNOSIS — Z79899 Other long term (current) drug therapy: Secondary | ICD-10-CM | POA: Insufficient documentation

## 2020-11-09 DIAGNOSIS — Z7901 Long term (current) use of anticoagulants: Secondary | ICD-10-CM | POA: Insufficient documentation

## 2020-11-09 DIAGNOSIS — I10 Essential (primary) hypertension: Secondary | ICD-10-CM | POA: Diagnosis not present

## 2020-11-09 DIAGNOSIS — Z794 Long term (current) use of insulin: Secondary | ICD-10-CM | POA: Insufficient documentation

## 2020-11-09 DIAGNOSIS — Z7984 Long term (current) use of oral hypoglycemic drugs: Secondary | ICD-10-CM | POA: Insufficient documentation

## 2020-11-09 DIAGNOSIS — I2692 Saddle embolus of pulmonary artery without acute cor pulmonale: Secondary | ICD-10-CM | POA: Insufficient documentation

## 2020-11-09 HISTORY — PX: A-FLUTTER ABLATION: EP1230

## 2020-11-09 LAB — GLUCOSE, CAPILLARY: Glucose-Capillary: 135 mg/dL — ABNORMAL HIGH (ref 70–99)

## 2020-11-09 SURGERY — A-FLUTTER ABLATION
Anesthesia: General

## 2020-11-09 MED ORDER — FENTANYL CITRATE (PF) 100 MCG/2ML IJ SOLN
INTRAMUSCULAR | Status: DC | PRN
  Administered 2020-11-09: 100 ug via INTRAVENOUS

## 2020-11-09 MED ORDER — ROCURONIUM BROMIDE 10 MG/ML (PF) SYRINGE
PREFILLED_SYRINGE | INTRAVENOUS | Status: DC | PRN
  Administered 2020-11-09: 50 mg via INTRAVENOUS

## 2020-11-09 MED ORDER — PHENYLEPHRINE HCL-NACL 10-0.9 MG/250ML-% IV SOLN
INTRAVENOUS | Status: DC | PRN
  Administered 2020-11-09: 30 ug/min via INTRAVENOUS

## 2020-11-09 MED ORDER — LIDOCAINE 2% (20 MG/ML) 5 ML SYRINGE
INTRAMUSCULAR | Status: DC | PRN
  Administered 2020-11-09: 40 mg via INTRAVENOUS

## 2020-11-09 MED ORDER — SUGAMMADEX SODIUM 200 MG/2ML IV SOLN
INTRAVENOUS | Status: DC | PRN
  Administered 2020-11-09: 200 mg via INTRAVENOUS

## 2020-11-09 MED ORDER — HEPARIN (PORCINE) IN NACL 1000-0.9 UT/500ML-% IV SOLN
INTRAVENOUS | Status: AC
  Filled 2020-11-09: qty 1000

## 2020-11-09 MED ORDER — PANTOPRAZOLE SODIUM 40 MG PO TBEC
40.0000 mg | DELAYED_RELEASE_TABLET | Freq: Every day | ORAL | Status: DC
  Administered 2020-11-09: 40 mg via ORAL
  Filled 2020-11-09: qty 1

## 2020-11-09 MED ORDER — ACETAMINOPHEN 325 MG PO TABS
ORAL_TABLET | ORAL | Status: AC
  Filled 2020-11-09: qty 2

## 2020-11-09 MED ORDER — HEPARIN (PORCINE) IN NACL 1000-0.9 UT/500ML-% IV SOLN
INTRAVENOUS | Status: DC | PRN
  Administered 2020-11-09 (×2): 500 mL

## 2020-11-09 MED ORDER — PANTOPRAZOLE SODIUM 40 MG PO TBEC
40.0000 mg | DELAYED_RELEASE_TABLET | Freq: Every day | ORAL | Status: DC
  Filled 2020-11-09: qty 1

## 2020-11-09 MED ORDER — ONDANSETRON HCL 4 MG/2ML IJ SOLN
INTRAMUSCULAR | Status: DC | PRN
  Administered 2020-11-09: 4 mg via INTRAVENOUS

## 2020-11-09 MED ORDER — ISOPROTERENOL HCL 0.2 MG/ML IJ SOLN
INTRAMUSCULAR | Status: AC
  Filled 2020-11-09: qty 5

## 2020-11-09 MED ORDER — HEPARIN SODIUM (PORCINE) 1000 UNIT/ML IJ SOLN
INTRAMUSCULAR | Status: DC | PRN
  Administered 2020-11-09: 1000 [IU] via INTRAVENOUS

## 2020-11-09 MED ORDER — HEPARIN SODIUM (PORCINE) 1000 UNIT/ML IJ SOLN
INTRAMUSCULAR | Status: AC
  Filled 2020-11-09: qty 1

## 2020-11-09 MED ORDER — ISOPROTERENOL HCL 0.2 MG/ML IJ SOLN
INTRAVENOUS | Status: DC | PRN
  Administered 2020-11-09: 4 ug/min via INTRAVENOUS

## 2020-11-09 MED ORDER — APIXABAN 5 MG PO TABS
5.0000 mg | ORAL_TABLET | Freq: Two times a day (BID) | ORAL | Status: DC
  Administered 2020-11-09: 5 mg via ORAL
  Filled 2020-11-09: qty 1

## 2020-11-09 MED ORDER — ACETAMINOPHEN 325 MG PO TABS
650.0000 mg | ORAL_TABLET | ORAL | Status: DC | PRN
  Administered 2020-11-09: 650 mg via ORAL

## 2020-11-09 MED ORDER — PROPOFOL 10 MG/ML IV BOLUS
INTRAVENOUS | Status: DC | PRN
  Administered 2020-11-09: 150 mg via INTRAVENOUS

## 2020-11-09 MED ORDER — MIDAZOLAM HCL 2 MG/2ML IJ SOLN
INTRAMUSCULAR | Status: DC | PRN
  Administered 2020-11-09: 2 mg via INTRAVENOUS

## 2020-11-09 MED ORDER — ONDANSETRON HCL 4 MG/2ML IJ SOLN: 4.0000 mg | Freq: Four times a day (QID) | INTRAMUSCULAR | Status: DC | PRN

## 2020-11-09 MED ORDER — SODIUM CHLORIDE 0.9 % IV SOLN: INTRAVENOUS | Status: DC

## 2020-11-09 SURGICAL SUPPLY — 14 items
BLANKET WARM UNDERBOD FULL ACC (MISCELLANEOUS) ×1 IMPLANT
CATH SMTCH THERMOCOOL SF DF (CATHETERS) ×1 IMPLANT
CATH SOUNDSTAR ECO 8FR (CATHETERS) ×1 IMPLANT
CATH WEB BI DIR CSDF CRV REPRO (CATHETERS) ×1 IMPLANT
CLOSURE PERCLOSE PROSTYLE (VASCULAR PRODUCTS) ×3 IMPLANT
COVER SWIFTLINK CONNECTOR (BAG) ×1 IMPLANT
PACK EP LATEX FREE (CUSTOM PROCEDURE TRAY) ×2
PACK EP LF (CUSTOM PROCEDURE TRAY) ×1 IMPLANT
PAD PRO RADIOLUCENT 2001M-C (PAD) ×2 IMPLANT
PATCH CARTO3 (PAD) ×1 IMPLANT
SHEATH PINNACLE 8F 10CM (SHEATH) ×2 IMPLANT
SHEATH PINNACLE 9F 10CM (SHEATH) ×1 IMPLANT
SHEATH PROBE COVER 6X72 (BAG) ×1 IMPLANT
TUBING COOLFLOW (TUBING) ×1 IMPLANT

## 2020-11-09 NOTE — Transfer of Care (Signed)
Immediate Anesthesia Transfer of Care Note  Patient: Bobby Fields  Procedure(s) Performed: A-FLUTTER ABLATION (N/A )  Patient Location: Cath Lab  Anesthesia Type:General  Level of Consciousness: awake, alert  and oriented  Airway & Oxygen Therapy: Patient Spontanous Breathing and Patient connected to nasal cannula oxygen  Post-op Assessment: Report given to RN and Post -op Vital signs reviewed and stable  Post vital signs: Reviewed and stable  Last Vitals:  Vitals Value Taken Time  BP 129/87 11/09/20 0901  Temp    Pulse 76 11/09/20 0904  Resp 16 11/09/20 0904  SpO2 93 % 11/09/20 0904  Vitals shown include unvalidated device data.  Last Pain:  Vitals:   11/09/20 0850  TempSrc:   PainSc: 0-No pain         Complications: No complications documented.

## 2020-11-09 NOTE — Anesthesia Preprocedure Evaluation (Signed)
Anesthesia Evaluation  Patient identified by MRN, date of birth, ID band  Reviewed: Allergy & Precautions, NPO status , Patient's Chart, lab work & pertinent test results  Airway Mallampati: II  TM Distance: >3 FB Neck ROM: Full    Dental  (+) Teeth Intact, Dental Advisory Given   Pulmonary    breath sounds clear to auscultation       Cardiovascular hypertension,  Rhythm:Regular Rate:Normal     Neuro/Psych    GI/Hepatic   Endo/Other  diabetes  Renal/GU      Musculoskeletal   Abdominal   Peds  Hematology   Anesthesia Other Findings   Reproductive/Obstetrics                             Anesthesia Physical Anesthesia Plan  ASA: III  Anesthesia Plan: General   Post-op Pain Management:    Induction: Intravenous  PONV Risk Score and Plan: Ondansetron and Dexamethasone  Airway Management Planned: Oral ETT  Additional Equipment:   Intra-op Plan:   Post-operative Plan: Extubation in OR  Informed Consent: I have reviewed the patients History and Physical, chart, labs and discussed the procedure including the risks, benefits and alternatives for the proposed anesthesia with the patient or authorized representative who has indicated his/her understanding and acceptance.     Dental advisory given  Plan Discussed with: CRNA and Anesthesiologist  Anesthesia Plan Comments:         Anesthesia Quick Evaluation

## 2020-11-09 NOTE — Anesthesia Postprocedure Evaluation (Signed)
Anesthesia Post Note  Patient: Bobby Fields  Procedure(s) Performed: A-FLUTTER ABLATION (N/A )     Patient location during evaluation: PACU Anesthesia Type: General Level of consciousness: awake and alert Pain management: pain level controlled Vital Signs Assessment: post-procedure vital signs reviewed and stable Respiratory status: spontaneous breathing, nonlabored ventilation, respiratory function stable and patient connected to nasal cannula oxygen Cardiovascular status: blood pressure returned to baseline and stable Postop Assessment: no apparent nausea or vomiting Anesthetic complications: no   No complications documented.  Last Vitals:  Vitals:   11/09/20 1100 11/09/20 1200  BP: 108/78 126/79  Pulse: 78 77  Resp: 15 17  Temp:    SpO2: 97% 96%    Last Pain:  Vitals:   11/09/20 1026  TempSrc:   PainSc: 4                  Jae Skeet COKER

## 2020-11-09 NOTE — Anesthesia Procedure Notes (Signed)
Procedure Name: Intubation Date/Time: 11/09/2020 7:40 AM Performed by: Valda Favia, CRNA Pre-anesthesia Checklist: Patient identified, Emergency Drugs available, Suction available and Patient being monitored Patient Re-evaluated:Patient Re-evaluated prior to induction Oxygen Delivery Method: Circle System Utilized Preoxygenation: Pre-oxygenation with 100% oxygen Induction Type: IV induction Ventilation: Mask ventilation without difficulty and Oral airway inserted - appropriate to patient size Laryngoscope Size: Mac and 4 Grade View: Grade I Tube type: Oral Tube size: 7.5 mm Number of attempts: 1 Airway Equipment and Method: Stylet and Oral airway Placement Confirmation: ETT inserted through vocal cords under direct vision,  positive ETCO2 and breath sounds checked- equal and bilateral Secured at: 21 cm Tube secured with: Tape Dental Injury: Teeth and Oropharynx as per pre-operative assessment

## 2020-11-09 NOTE — Discharge Instructions (Signed)

## 2020-11-09 NOTE — H&P (Signed)
Electrophysiology Office Follow up Visit Note:    Date:  09/22/2020   ID:  Bobby Fields, DOB 20-Mar-1951, MRN [id number]  PCP:  Nicoletta Dress, MD            [facility] Cardiologist:  Candee Furbish, MD  [facility] Electrophysiologist:  Vickie Epley, MD    Interval History:    Bobby Fields is a 70 y.o. male who presents for a follow up visit.  I first met the patient August 15, 2020 when he was hospitalized for atrial flutter and found to have a submassive PE/saddle embolus.  He underwent catheter directed thrombolytics.  He is in clinic with his wife today.  Since leaving the hospital he has been doing well.  He is tolerating the Eliquis without bleeding issues.  He is back walking 3 times a day for 15 minutes each session without difficulty.  No lightheadedness or dizziness or syncope.         Past Medical History:  Diagnosis Date  . Atrial flutter ([place])   . Diabetes mellitus without complication ([place])   . High cholesterol   . Hypertension          Past Surgical History:  Procedure Laterality Date  . IR ANGIOGRAM PULMONARY BILATERAL SELECTIVE  08/15/2020  . IR ANGIOGRAM SELECTIVE EACH ADDITIONAL VESSEL  08/15/2020  . IR ANGIOGRAM SELECTIVE EACH ADDITIONAL VESSEL  08/15/2020  . IR INFUSION THROMBOL ARTERIAL INITIAL (MS)  08/15/2020  . IR INFUSION THROMBOL ARTERIAL INITIAL (MS)  08/15/2020  . IR THROMB F/U EVAL ART/VEN FINAL DAY (MS)  08/16/2020    Current Medications: Active Medications      Current Meds  Medication Sig  . apixaban (ELIQUIS) 5 MG TABS tablet Take 1 tablet (5 mg total) by mouth 2 (two) times daily.  . Calcium Carb-Cholecalciferol 600-800 MG-UNIT TABS Take 1 tablet by mouth 2 (two) times daily.  Marland Kitchen ezetimibe (ZETIA) 10 MG tablet Take 10 mg by mouth daily.  Marland Kitchen glimepiride (AMARYL) 4 MG tablet Take 4 mg by mouth daily with breakfast.  . JANUVIA 100 MG tablet Take 100 mg by mouth daily.  Marland Kitchen lisinopril (ZESTRIL) 5 MG tablet Take 5  mg by mouth at bedtime.  Marland Kitchen omeprazole (PRILOSEC) 20 MG capsule Take 20 mg by mouth daily.  . pravastatin (PRAVACHOL) 20 MG tablet Take 20 mg by mouth once a week. Sundays  . TRESIBA FLEXTOUCH 100 UNIT/ML FlexTouch Pen Inject 20 Units into the skin at bedtime.       Allergies:   Indomethacin   Social History        Socioeconomic History  . Marital status: Married    Spouse name: Not on file  . Number of children: Not on file  . Years of education: Not on file  . Highest education level: Not on file  Occupational History  . Not on file  Tobacco Use  . Smoking status: Never Smoker  . Smokeless tobacco: Never Used  Substance and Sexual Activity  . Alcohol use: Never  . Drug use: Not on file  . Sexual activity: Not on file  Other Topics Concern  . Not on file  Social History Narrative  . Not on file   Social Determinants of Health   Financial Resource Strain: Not on file  Food Insecurity: Not on file  Transportation Needs: Not on file  Physical Activity: Not on file  Stress: Not on file  Social Connections: Not on file     Family History: The  patient's family history is not on file.  ROS:   Please see the history of present illness.    All other systems reviewed and are negative.  EKGs/Labs/Other Studies Reviewed:    The following studies were reviewed today:  September 22, 2020 echo personally reviewed LV function normal RV size appears normal on my read although I am awaiting the formal read. RV function appears mildly reduced  EKG:  The ekg from September 21, 2020 shows typical atrial flutter with variable AV conduction with a ventricular rate of 78 bpm    Recent Labs: 08/14/2020: TSH 0.505 08/15/2020: Magnesium 2.0 09/20/2020: BUN 18; Creatinine, Ser 1.39; Hemoglobin 15.7; Platelets 214; Potassium 4.2; Sodium 137  Recent Lipid Panel Labs (Brief)  No results found for: CHOL, TRIG, HDL, CHOLHDL, VLDL, LDLCALC, LDLDIRECT    Physical Exam:     VS:  BP 132/86   Pulse 77   Ht '5\' 11"'  (1.803 m)   Wt 200 lb (90.7 kg)   SpO2 97%   BMI 27.89 kg/m        Wt Readings from Last 3 Encounters:  09/22/20 200 lb (90.7 kg)  09/01/20 203 lb 1.9 oz (92.1 kg)  08/16/20 205 lb 0.4 oz (93 kg)     GEN:  Well nourished, well developed in no acute distress HEENT: Normal NECK: No JVD; No carotid bruits LYMPHATICS: No lymphadenopathy CARDIAC: Irregularly irregular, no murmurs, rubs, gallops RESPIRATORY:  Clear to auscultation without rales, wheezing or rhonchi  ABDOMEN: Soft, non-tender, non-distended MUSCULOSKELETAL:  No edema; No deformity  SKIN: Warm and dry NEUROLOGIC:  Alert and oriented x 3 PSYCHIATRIC:  Normal affect   ASSESSMENT:    1. Typical atrial flutter ([place])   2. Acute saddle pulmonary embolism, unspecified whether acute cor pulmonale present (HCC)    PLAN:    In order of problems listed above:  1. Typical atrial flutter In the setting of RV dysfunction in the setting of an acute pulmonary embolus.  Patient is now post catheter directed thrombolytics.  He remains in rate controlled atrial flutter.  Ideally, would plan to pursue rhythm control for [name].  Discussed treatment options including cardioversion, antiarrhythmic therapy and ablation.  In an effort to obtain durable results, plan to pursue ablation of his typical atrial flutter.  Would like to wait at least 6-8 more weeks and schedule a procedure for early May to allow him several months of anticoagulation after his pulmonary embolus last month.  I discussed the procedure in detail with the patient including the risks, expected recovery.  I specifically discussed the possibility of uncovering atrial fibrillation after a successful atrial flutter ablation.  I would plan to implant a loop recorder after 3 months from the flutter ablation.  Risk, benefits, and alternatives to EP study and radiofrequency ablation for AFL were also discussed in detail today.  These risks include but are not limited to stroke, bleeding, vascular damage, tamponade, perforation, damage to the esophagus, lungs, and other structures, pulmonary vein stenosis, worsening renal function, and death. The patient understands these risk and wishes to proceed.  We will therefore proceed with catheter ablation at the next available time.  Carto, ICE, anesthesia are requested for the procedure.     2.  Acute PE Post catheter directed thrombolytics Continue Eliquis uninterrupted    -----------------------------------------------------------------------  I have seen, examined the patient, and reviewed the above assessment and plan.    Plan for CTI ablation today.   Vickie Epley, MD 11/09/2020 7:12 AM

## 2020-11-09 NOTE — Progress Notes (Signed)
Pt ambulated without difficulty or bleeding.   Discharged home with wife who will drive and stay with pt x 24 hrs 

## 2020-11-11 ENCOUNTER — Telehealth: Payer: Self-pay | Admitting: Cardiology

## 2020-11-11 MED ORDER — APIXABAN 5 MG PO TABS: 5.0000 mg | ORAL_TABLET | Freq: Two times a day (BID) | ORAL | 1 refills | Status: DC

## 2020-11-11 NOTE — Telephone Encounter (Signed)
Prescription refill request for Eliquis received. Indication:afib Last office visit:09/22/20 Scr:1.42 Age: 70 Weight:90.7kg

## 2020-11-11 NOTE — Telephone Encounter (Signed)
*  STAT* If patient is at the pharmacy, call can be transferred to refill team.   1. Which medications need to be refilled? (please list name of each medication and dose if known)  apixaban (ELIQUIS) 5 MG TABS tablet  2. Which pharmacy/location (including street and city if local pharmacy) is medication to be sent to? [place], [place]  3. Do they need a 30 day or 90 day supply?  90 day supply  Per Baxter Flattery, the patient is completely out of medication.

## 2020-11-13 ENCOUNTER — Telehealth: Payer: Self-pay | Admitting: Cardiology

## 2020-11-13 NOTE — Telephone Encounter (Signed)
See mychart response.  Continue to monitor.

## 2020-11-13 NOTE — Telephone Encounter (Signed)
    Pt said he had a procedure done last Monday by Dr. Quentin Ore. He said the area where he had his procedure is a little red and a little warm to touch, he can feel some soreness and when he move certain way pain increases. He wanted to know if this is normal and if there's something he can do

## 2020-11-16 ENCOUNTER — Encounter (HOSPITAL_COMMUNITY): Payer: Self-pay | Admitting: Physician Assistant

## 2020-11-16 ENCOUNTER — Ambulatory Visit (HOSPITAL_COMMUNITY)
Admission: RE | Admit: 2020-11-16 | Discharge: 2020-11-16 | Disposition: A | Discharge status: Home / Self Care | Payer: Medicare HMO | Source: Ambulatory Visit | Attending: Physician Assistant | Admitting: Physician Assistant

## 2020-11-16 ENCOUNTER — Other Ambulatory Visit: Payer: Self-pay

## 2020-11-16 VITALS — BP 126/82 | HR 88 | Ht 71.0 in | Wt 205.4 lb

## 2020-11-16 DIAGNOSIS — I10 Essential (primary) hypertension: Secondary | ICD-10-CM | POA: Insufficient documentation

## 2020-11-16 DIAGNOSIS — Z7901 Long term (current) use of anticoagulants: Secondary | ICD-10-CM | POA: Insufficient documentation

## 2020-11-16 DIAGNOSIS — Z7984 Long term (current) use of oral hypoglycemic drugs: Secondary | ICD-10-CM | POA: Diagnosis not present

## 2020-11-16 DIAGNOSIS — E119 Type 2 diabetes mellitus without complications: Secondary | ICD-10-CM | POA: Insufficient documentation

## 2020-11-16 DIAGNOSIS — Z79899 Other long term (current) drug therapy: Secondary | ICD-10-CM | POA: Diagnosis not present

## 2020-11-16 DIAGNOSIS — I483 Typical atrial flutter: Secondary | ICD-10-CM | POA: Diagnosis not present

## 2020-11-16 DIAGNOSIS — E785 Hyperlipidemia, unspecified: Secondary | ICD-10-CM | POA: Insufficient documentation

## 2020-11-16 DIAGNOSIS — Z888 Allergy status to other drugs, medicaments and biological substances status: Secondary | ICD-10-CM | POA: Diagnosis not present

## 2020-11-16 DIAGNOSIS — D6869 Other thrombophilia: Secondary | ICD-10-CM | POA: Diagnosis not present

## 2020-11-16 LAB — BASIC METABOLIC PANEL
Anion gap: 10 (ref 5–15)
BUN: 18 mg/dL (ref 8–23)
CO2: 23 mmol/L (ref 22–32)
Calcium: 9.3 mg/dL (ref 8.9–10.3)
Chloride: 102 mmol/L (ref 98–111)
Creatinine, Ser: 1.37 mg/dL — ABNORMAL HIGH (ref 0.61–1.24)
GFR, Estimated: 55 mL/min — ABNORMAL LOW (ref >60)
Glucose, Bld: 221 mg/dL — ABNORMAL HIGH (ref 70–99)
Potassium: 4.1 mmol/L (ref 3.5–5.1)
Sodium: 135 mmol/L (ref 135–145)

## 2020-11-16 LAB — CBC
HCT: 50.6 % (ref 39.0–52.0)
Hemoglobin: 15.8 g/dL (ref 13.0–17.0)
MCH: 26.7 pg (ref 26.0–34.0)
MCHC: 31.2 g/dL (ref 30.0–36.0)
MCV: 85.5 fL (ref 80.0–100.0)
Platelets: 252 10*3/uL (ref 150–400)
RBC: 5.92 MIL/uL — ABNORMAL HIGH (ref 4.22–5.81)
RDW: 13.5 % (ref 11.5–15.5)
WBC: 9.4 10*3/uL (ref 4.0–10.5)
nRBC: 0 % (ref 0.0–0.2)

## 2020-11-16 NOTE — Progress Notes (Signed)
Primary Care Physician: Nicoletta Dress, MD Primary Cardiologist: Dr Marlou Porch Primary Electrophysiologist: Dr Quentin Ore Referring Physician: Dr Valli Glance is a 70 y.o. male with a history of DM, HTN, HLD, ulcerative colitis with J pouch, and atrial flutter who presents for follow up in the [facility]. Admitted earlier this month with syncope - found to be in [place] yesterday with initial thoughts that this was symptomatic atrial flutter. Admitted to PCU where subsequent workup revealed elevated D dimer and CTA chest showed saddle PE with R heart strain function. He was treated with TPA. Then placed on Eliquis. He has a CHADS2VASC score of 3.   On follow up today, patient is s/p atrial flutter ablation 11/09/20. He has not had any tachypalpitations. He denies CP or swallowing issues. He reported increased swelling, tenderness and redness at his cath sites. He also had a temp ~ 38F and nausea.   Today, he denies symptoms of palpitations, chest pain, shortness of breath, orthopnea, PND, lower extremity edema, dizziness, presyncope, syncope, snoring, daytime somnolence, bleeding, or neurologic sequela. The patient is tolerating medications without difficulties and is otherwise without complaint today.    Atrial Fibrillation Risk Factors:  he does not have symptoms or diagnosis of sleep apnea. he does not have a history of rheumatic fever.   he has a BMI of Body mass index is 28.65 kg/m.Marland Kitchen Filed Weights   11/16/20 1414  Weight: 93.2 kg    No family history on file.   Atrial Fibrillation Management history:  Previous antiarrhythmic drugs: none Previous cardioversions: none Previous ablations: 11/09/20 flutter CHADS2VASC score: 3 Anticoagulation history: Eliquis   Past Medical History:  Diagnosis Date  . Atrial flutter ([place])   . Diabetes mellitus without complication ([place])   . High cholesterol   . Hypertension    Past Surgical History:   Procedure Laterality Date  . A-FLUTTER ABLATION N/A 11/09/2020   Procedure: A-FLUTTER ABLATION;  Surgeon: Vickie Epley, MD;  Location: [facility];  Service: Cardiovascular;  Laterality: N/A;  . IR ANGIOGRAM PULMONARY BILATERAL SELECTIVE  08/15/2020  . IR ANGIOGRAM SELECTIVE EACH ADDITIONAL VESSEL  08/15/2020  . IR ANGIOGRAM SELECTIVE EACH ADDITIONAL VESSEL  08/15/2020  . IR INFUSION THROMBOL ARTERIAL INITIAL (MS)  08/15/2020  . IR INFUSION THROMBOL ARTERIAL INITIAL (MS)  08/15/2020  . IR THROMB F/U EVAL ART/VEN FINAL DAY (MS)  08/16/2020    Current Outpatient Medications  Medication Sig Dispense Refill  . acetaminophen (TYLENOL) 500 MG tablet Take 500-1,000 mg by mouth every 6 (six) hours as needed (pain).    Marland Kitchen apixaban (ELIQUIS) 5 MG TABS tablet Take 1 tablet (5 mg total) by mouth 2 (two) times daily. 180 tablet 1  . BD PEN NEEDLE NANO U/F 32G X 4 MM MISC use AS DIRECTED EVERY DAY    . Calcium Carb-Cholecalciferol 600-800 MG-UNIT TABS Take 1 tablet by mouth 2 (two) times daily.    . cetirizine (ZYRTEC ALLERGY) 10 MG tablet Take 10 mg by mouth daily.    Marland Kitchen ezetimibe (ZETIA) 10 MG tablet Take 10 mg by mouth in the morning.    Marland Kitchen glimepiride (AMARYL) 4 MG tablet Take 4 mg by mouth in the morning.    Marland Kitchen JANUVIA 100 MG tablet Take 100 mg by mouth in the morning.    . Lancets (ONETOUCH DELICA PLUS [id number]) MISC Apply topically 3 (three) times daily.    Marland Kitchen lisinopril (ZESTRIL) 5 MG tablet Take 5 mg by  mouth at bedtime.    Marland Kitchen omeprazole (PRILOSEC) 20 MG capsule Take 20 mg by mouth in the morning.    Glory Rosebush VERIO test strip 3 (three) times daily.    . pravastatin (PRAVACHOL) 20 MG tablet Take 20 mg by mouth every Sunday.    Tyler Aas FLEXTOUCH 100 UNIT/ML FlexTouch Pen Inject 20 Units into the skin at bedtime.     No current facility-administered medications for this encounter.    Allergies  Allergen Reactions  . Indomethacin Diarrhea    Other reaction(s): Other (See Comments) "caused  ulcerative colitis;bleeding ulcers"     Social History   Socioeconomic History  . Marital status: Married    Spouse name: Not on file  . Number of children: Not on file  . Years of education: Not on file  . Highest education level: Not on file  Occupational History  . Not on file  Tobacco Use  . Smoking status: Never Smoker  . Smokeless tobacco: Never Used  Substance and Sexual Activity  . Alcohol use: Yes    Alcohol/week: 1.0 standard drink    Types: 1 Cans of beer per week    Comment: weekly  . Drug use: Never  . Sexual activity: Not on file  Other Topics Concern  . Not on file  Social History Narrative  . Not on file   Social Determinants of Health   Financial Resource Strain: Not on file  Food Insecurity: Not on file  Transportation Needs: Not on file  Physical Activity: Not on file  Stress: Not on file  Social Connections: Not on file  Intimate Partner Violence: Not on file     ROS- All systems are reviewed and negative except as per the HPI above.  Physical Exam: Vitals:   11/16/20 1414  BP: 126/82  Pulse: 88  SpO2: 96%  Weight: 93.2 kg  Height: 5\' 11"  (1.803 m)    GEN- The patient is a well appearing male, alert and oriented x 3 today.  Head- normocephalic, atraumatic Eyes-  Sclera clear, conjunctiva pink Ears- hearing intact Oropharynx- clear Neck- supple  Lungs- Clear to ausculation bilaterally, normal work of breathing Heart- Regular rate and rhythm, no murmurs, rubs or gallops  GI- soft, NT, ND, + BS Extremities- no clubbing, cyanosis, or edema. Small dime sized hematoma on left side, no redness, mild tenderness.  MS- no significant deformity or atrophy Skin- no rash or lesion Psych- euthymic mood, full affect Neuro- strength and sensation are intact  Wt Readings from Last 3 Encounters:  11/16/20 93.2 kg  11/09/20 90.7 kg  09/22/20 90.7 kg    EKG is not ordered today  Echo 09/22/20 demonstrated  1. Compared with the echo 08/2020  the right ventricle is less dilated and  systolic function has improved but not normalized.  2. Left ventricular ejection fraction, by estimation, is 55 to 60%. The  left ventricle has normal function. Left ventricular diastolic parameters  are indeterminate.  3. Right ventricular systolic function is mildly reduced. The right  ventricular size is normal. There is normal pulmonary artery systolic  pressure.  4. The aortic valve is calcified. There is moderate calcification of the  aortic valve. There is moderate thickening of the aortic valve.  5. Aortic dilatation noted. There is mild dilatation of the aortic root,  measuring 37 mm.  6. The inferior vena cava is normal in size with greater than 50%  respiratory variability, suggesting right atrial pressure of 3 mmHg.   Comparison(s):  08/14/20 EF 65-70%.   Epic records are reviewed at length today  CHA2DS2-VASc Score = 3  The patient's score is based upon: CHF History: No HTN History: Yes Diabetes History: Yes Stroke History: No Vascular Disease History: No Age Score: 1 Gender Score: 0      ASSESSMENT AND PLAN: 1. Typical atrial flutter The patient's CHA2DS2-VASc score is 3, indicating a 3.2% annual risk of stroke.   S/p flutter ablation 11/09/20 He appears to be maintaining SR.  Continue Eliquis 5 mg BID post ablation and post saddle PE. Plan for ILR at 3 months noted.  Groin sites consitent with normal post ablation healing. ? If symptoms related to GI issues. Patient in contact with his GI provider.  Check bmet/cbc  2. Secondary Hypercoagulable State (ICD10:  D68.69) The patient is at significant risk for stroke/thromboembolism based upon his CHA2DS2-VASc Score of 3.  Continue Apixaban (Eliquis).   3. HTN Stable, no changes today.   Follow up in the AF clinic as scheduled.    [facility] [address] [place], [place] [address] [phone] 11/16/2020 2:18 PM

## 2020-11-18 DIAGNOSIS — R109 Unspecified abdominal pain: Secondary | ICD-10-CM | POA: Diagnosis not present

## 2020-11-18 DIAGNOSIS — Z6827 Body mass index (BMI) 27.0-27.9, adult: Secondary | ICD-10-CM | POA: Diagnosis not present

## 2020-11-19 DIAGNOSIS — K56699 Other intestinal obstruction unspecified as to partial versus complete obstruction: Secondary | ICD-10-CM | POA: Diagnosis not present

## 2020-11-19 DIAGNOSIS — R1032 Left lower quadrant pain: Secondary | ICD-10-CM | POA: Diagnosis not present

## 2020-11-19 DIAGNOSIS — N179 Acute kidney failure, unspecified: Secondary | ICD-10-CM | POA: Diagnosis not present

## 2020-11-19 DIAGNOSIS — Z01810 Encounter for preprocedural cardiovascular examination: Secondary | ICD-10-CM | POA: Diagnosis not present

## 2020-11-19 DIAGNOSIS — I4892 Unspecified atrial flutter: Secondary | ICD-10-CM | POA: Diagnosis not present

## 2020-11-19 DIAGNOSIS — E119 Type 2 diabetes mellitus without complications: Secondary | ICD-10-CM | POA: Diagnosis not present

## 2020-11-19 DIAGNOSIS — K56609 Unspecified intestinal obstruction, unspecified as to partial versus complete obstruction: Secondary | ICD-10-CM | POA: Diagnosis not present

## 2020-11-19 DIAGNOSIS — Z86711 Personal history of pulmonary embolism: Secondary | ICD-10-CM | POA: Diagnosis not present

## 2020-11-19 DIAGNOSIS — E8809 Other disorders of plasma-protein metabolism, not elsewhere classified: Secondary | ICD-10-CM | POA: Diagnosis not present

## 2020-11-19 DIAGNOSIS — K565 Intestinal adhesions [bands], unspecified as to partial versus complete obstruction: Secondary | ICD-10-CM | POA: Diagnosis not present

## 2020-11-19 DIAGNOSIS — X58XXXA Exposure to other specified factors, initial encounter: Secondary | ICD-10-CM | POA: Diagnosis not present

## 2020-11-19 DIAGNOSIS — K56600 Partial intestinal obstruction, unspecified as to cause: Secondary | ICD-10-CM | POA: Diagnosis not present

## 2020-11-19 DIAGNOSIS — D72829 Elevated white blood cell count, unspecified: Secondary | ICD-10-CM | POA: Diagnosis not present

## 2020-11-19 DIAGNOSIS — K50919 Crohn's disease, unspecified, with unspecified complications: Secondary | ICD-10-CM | POA: Diagnosis not present

## 2020-11-19 DIAGNOSIS — Z7901 Long term (current) use of anticoagulants: Secondary | ICD-10-CM | POA: Diagnosis not present

## 2020-11-19 DIAGNOSIS — E785 Hyperlipidemia, unspecified: Secondary | ICD-10-CM | POA: Diagnosis not present

## 2020-11-19 DIAGNOSIS — I4891 Unspecified atrial fibrillation: Secondary | ICD-10-CM | POA: Diagnosis not present

## 2020-11-19 DIAGNOSIS — I483 Typical atrial flutter: Secondary | ICD-10-CM | POA: Diagnosis not present

## 2020-11-21 DIAGNOSIS — D72829 Elevated white blood cell count, unspecified: Secondary | ICD-10-CM | POA: Diagnosis not present

## 2020-11-21 DIAGNOSIS — I4891 Unspecified atrial fibrillation: Secondary | ICD-10-CM | POA: Diagnosis not present

## 2020-11-21 DIAGNOSIS — K56600 Partial intestinal obstruction, unspecified as to cause: Secondary | ICD-10-CM | POA: Diagnosis not present

## 2020-11-21 DIAGNOSIS — E119 Type 2 diabetes mellitus without complications: Secondary | ICD-10-CM | POA: Diagnosis not present

## 2020-11-22 DIAGNOSIS — K56600 Partial intestinal obstruction, unspecified as to cause: Secondary | ICD-10-CM | POA: Diagnosis not present

## 2020-11-22 DIAGNOSIS — I4891 Unspecified atrial fibrillation: Secondary | ICD-10-CM | POA: Diagnosis not present

## 2020-11-22 DIAGNOSIS — K56609 Unspecified intestinal obstruction, unspecified as to partial versus complete obstruction: Secondary | ICD-10-CM | POA: Diagnosis not present

## 2020-11-22 DIAGNOSIS — E119 Type 2 diabetes mellitus without complications: Secondary | ICD-10-CM | POA: Diagnosis not present

## 2020-12-02 DIAGNOSIS — Z7689 Persons encountering health services in other specified circumstances: Secondary | ICD-10-CM | POA: Diagnosis not present

## 2020-12-02 DIAGNOSIS — E785 Hyperlipidemia, unspecified: Secondary | ICD-10-CM | POA: Diagnosis not present

## 2020-12-02 DIAGNOSIS — K56600 Partial intestinal obstruction, unspecified as to cause: Secondary | ICD-10-CM | POA: Diagnosis not present

## 2020-12-02 DIAGNOSIS — N183 Chronic kidney disease, stage 3 unspecified: Secondary | ICD-10-CM | POA: Diagnosis not present

## 2020-12-02 DIAGNOSIS — I129 Hypertensive chronic kidney disease with stage 1 through stage 4 chronic kidney disease, or unspecified chronic kidney disease: Secondary | ICD-10-CM | POA: Diagnosis not present

## 2020-12-02 DIAGNOSIS — E559 Vitamin D deficiency, unspecified: Secondary | ICD-10-CM | POA: Diagnosis not present

## 2020-12-08 ENCOUNTER — Encounter (HOSPITAL_COMMUNITY): Payer: Self-pay | Admitting: Physician Assistant

## 2020-12-08 ENCOUNTER — Other Ambulatory Visit: Payer: Self-pay

## 2020-12-08 ENCOUNTER — Ambulatory Visit (HOSPITAL_COMMUNITY)
Admission: RE | Admit: 2020-12-08 | Discharge: 2020-12-08 | Disposition: A | Discharge status: Home / Self Care | Payer: Medicare HMO | Source: Ambulatory Visit | Attending: Physician Assistant | Admitting: Physician Assistant

## 2020-12-08 VITALS — BP 134/80 | HR 79 | Ht 71.0 in | Wt 202.8 lb

## 2020-12-08 DIAGNOSIS — I4892 Unspecified atrial flutter: Secondary | ICD-10-CM | POA: Diagnosis not present

## 2020-12-08 DIAGNOSIS — E785 Hyperlipidemia, unspecified: Secondary | ICD-10-CM | POA: Insufficient documentation

## 2020-12-08 DIAGNOSIS — E7800 Pure hypercholesterolemia, unspecified: Secondary | ICD-10-CM | POA: Insufficient documentation

## 2020-12-08 DIAGNOSIS — I10 Essential (primary) hypertension: Secondary | ICD-10-CM | POA: Insufficient documentation

## 2020-12-08 DIAGNOSIS — Z7901 Long term (current) use of anticoagulants: Secondary | ICD-10-CM | POA: Insufficient documentation

## 2020-12-08 DIAGNOSIS — I483 Typical atrial flutter: Secondary | ICD-10-CM

## 2020-12-08 DIAGNOSIS — D6869 Other thrombophilia: Secondary | ICD-10-CM

## 2020-12-08 NOTE — Progress Notes (Signed)
Primary Care Physician: Nicoletta Dress, MD Primary Cardiologist: Dr Marlou Porch Primary Electrophysiologist: Dr Quentin Ore Referring Physician: Dr Valli Glance is a 70 y.o. male with a history of DM, HTN, HLD, ulcerative colitis with J pouch, and atrial flutter who presents for follow up in the [facility]. Admitted earlier this month with syncope - found to be in [place] yesterday with initial thoughts that this was symptomatic atrial flutter. Admitted to PCU where subsequent workup revealed elevated D dimer and CTA chest showed saddle PE with R heart strain function. He was treated with TPA. Then placed on Eliquis. He has a CHADS2VASC score of 3.   On follow up today, patient is s/p atrial flutter ablation 11/09/20. He has done well with no heart racing or palpitations. He was hospitalized and underwent surgery for a low grade SMO on 11/23/20. He is feeling well with no further GI symptoms and has follow up with his surgeon tomorrow. His groin swelling is resolving. He denies CP or swallowing pain.   Today, he denies symptoms of palpitations, chest pain, shortness of breath, orthopnea, PND, lower extremity edema, dizziness, presyncope, syncope, snoring, daytime somnolence, bleeding, or neurologic sequela. The patient is tolerating medications without difficulties and is otherwise without complaint today.    Atrial Fibrillation Risk Factors:  he does not have symptoms or diagnosis of sleep apnea. he does not have a history of rheumatic fever.   he has a BMI of Body mass index is 28.28 kg/m.Marland Kitchen Filed Weights   12/08/20 0919  Weight: 92 kg    No family history on file.   Atrial Fibrillation Management history:  Previous antiarrhythmic drugs: none Previous cardioversions: none Previous ablations: 11/09/20 flutter CHADS2VASC score: 3 Anticoagulation history: Eliquis   Past Medical History:  Diagnosis Date  . Atrial flutter ([place])   . Diabetes  mellitus without complication ([place])   . High cholesterol   . Hypertension    Past Surgical History:  Procedure Laterality Date  . A-FLUTTER ABLATION N/A 11/09/2020   Procedure: A-FLUTTER ABLATION;  Surgeon: Vickie Epley, MD;  Location: [facility];  Service: Cardiovascular;  Laterality: N/A;  . IR ANGIOGRAM PULMONARY BILATERAL SELECTIVE  08/15/2020  . IR ANGIOGRAM SELECTIVE EACH ADDITIONAL VESSEL  08/15/2020  . IR ANGIOGRAM SELECTIVE EACH ADDITIONAL VESSEL  08/15/2020  . IR INFUSION THROMBOL ARTERIAL INITIAL (MS)  08/15/2020  . IR INFUSION THROMBOL ARTERIAL INITIAL (MS)  08/15/2020  . IR THROMB F/U EVAL ART/VEN FINAL DAY (MS)  08/16/2020    Current Outpatient Medications  Medication Sig Dispense Refill  . acetaminophen (TYLENOL) 500 MG tablet Take 500-1,000 mg by mouth every 6 (six) hours as needed (pain).    Marland Kitchen apixaban (ELIQUIS) 5 MG TABS tablet Take 1 tablet (5 mg total) by mouth 2 (two) times daily. 180 tablet 1  . BD PEN NEEDLE NANO U/F 32G X 4 MM MISC use AS DIRECTED EVERY DAY    . Calcium Carb-Cholecalciferol 600-800 MG-UNIT TABS Take 1 tablet by mouth 2 (two) times daily.    . cetirizine (ZYRTEC) 10 MG tablet Take 10 mg by mouth daily.    Marland Kitchen ezetimibe (ZETIA) 10 MG tablet Take 10 mg by mouth in the morning.    Marland Kitchen glimepiride (AMARYL) 4 MG tablet Take 4 mg by mouth in the morning.    Marland Kitchen JANUVIA 100 MG tablet Take 100 mg by mouth in the morning.    . Lancets (ONETOUCH DELICA PLUS [id number]) [place] Apply  topically 3 (three) times daily.    Marland Kitchen lisinopril (ZESTRIL) 5 MG tablet Take 5 mg by mouth at bedtime.    Marland Kitchen omeprazole (PRILOSEC) 20 MG capsule Take 20 mg by mouth in the morning.    Glory Rosebush VERIO test strip 3 (three) times daily.    . pravastatin (PRAVACHOL) 20 MG tablet Take 20 mg by mouth every Sunday.    Tyler Aas FLEXTOUCH 100 UNIT/ML FlexTouch Pen Inject 20 Units into the skin at bedtime.     No current facility-administered medications for this encounter.    Allergies   Allergen Reactions  . Indomethacin Diarrhea    Other reaction(s): Other (See Comments) "caused ulcerative colitis;bleeding ulcers"     Social History   Socioeconomic History  . Marital status: Married    Spouse name: Not on file  . Number of children: Not on file  . Years of education: Not on file  . Highest education level: Not on file  Occupational History  . Not on file  Tobacco Use  . Smoking status: Never Smoker  . Smokeless tobacco: Never Used  Substance and Sexual Activity  . Alcohol use: Yes    Alcohol/week: 1.0 standard drink    Types: 1 Cans of beer per week    Comment: weekly  . Drug use: Never  . Sexual activity: Not on file  Other Topics Concern  . Not on file  Social History Narrative  . Not on file   Social Determinants of Health   Financial Resource Strain: Not on file  Food Insecurity: Not on file  Transportation Needs: Not on file  Physical Activity: Not on file  Stress: Not on file  Social Connections: Not on file  Intimate Partner Violence: Not on file     ROS- All systems are reviewed and negative except as per the HPI above.  Physical Exam: Vitals:   12/08/20 0919  BP: 134/80  Pulse: 79  Weight: 92 kg  Height: 5\' 11"  (1.803 m)    GEN- The patient is a well appearing male, alert and oriented x 3 today.   HEENT-head normocephalic, atraumatic, sclera clear, conjunctiva pink, hearing intact, trachea midline. Lungs- Clear to ausculation bilaterally, normal work of breathing Heart- Regular rate and rhythm, no murmurs, rubs or gallops  GI- soft, NT, ND, + BS Extremities- no clubbing, cyanosis, or edema MS- no significant deformity or atrophy Skin- no rash or lesion Psych- euthymic mood, full affect Neuro- strength and sensation are intact   Wt Readings from Last 3 Encounters:  12/08/20 92 kg  11/16/20 93.2 kg  11/09/20 90.7 kg    EKG today demonstrates  SR, 1st degree AV block Vent. rate 79 BPM PR interval 230 ms QRS  duration 76 ms QT/QTcB 350/401 ms  Echo 09/22/20 demonstrated  1. Compared with the echo 08/2020 the right ventricle is less dilated and  systolic function has improved but not normalized.  2. Left ventricular ejection fraction, by estimation, is 55 to 60%. The  left ventricle has normal function. Left ventricular diastolic parameters  are indeterminate.  3. Right ventricular systolic function is mildly reduced. The right  ventricular size is normal. There is normal pulmonary artery systolic  pressure.  4. The aortic valve is calcified. There is moderate calcification of the  aortic valve. There is moderate thickening of the aortic valve.  5. Aortic dilatation noted. There is mild dilatation of the aortic root,  measuring 37 mm.  6. The inferior vena cava is normal in  size with greater than 50%  respiratory variability, suggesting right atrial pressure of 3 mmHg.   Comparison(s): 08/14/20 EF 65-70%.   Epic records are reviewed at length today  CHA2DS2-VASc Score = 3  The patient's score is based upon: CHF History: No HTN History: Yes Diabetes History: Yes Stroke History: No Vascular Disease History: No Age Score: 1 Gender Score: 0      ASSESSMENT AND PLAN: 1. Typical atrial flutter The patient's CHA2DS2-VASc score is 3, indicating a 3.2% annual risk of stroke.   S/p flutter ablation 11/09/20 He appears to be maintaining SR.  Continue Eliquis 5 mg BID Plan for ILR at 3 months noted.   2. Secondary Hypercoagulable State (ICD10:  D68.69) The patient is at significant risk for stroke/thromboembolism based upon his CHA2DS2-VASc Score of 3.  Continue Apixaban (Eliquis).   3. HTN Stable, no changes today.   Follow up with Dr Quentin Ore as scheduled.    [facility] [address] [place], [place] [address] [phone] 12/08/2020 9:47 AM

## 2020-12-09 DIAGNOSIS — Z1331 Encounter for screening for depression: Secondary | ICD-10-CM | POA: Diagnosis not present

## 2020-12-09 DIAGNOSIS — Z9181 History of falling: Secondary | ICD-10-CM | POA: Diagnosis not present

## 2020-12-09 DIAGNOSIS — E785 Hyperlipidemia, unspecified: Secondary | ICD-10-CM | POA: Diagnosis not present

## 2020-12-09 DIAGNOSIS — Z0000 Encounter for general adult medical examination without abnormal findings: Secondary | ICD-10-CM | POA: Diagnosis not present

## 2020-12-24 DIAGNOSIS — E119 Type 2 diabetes mellitus without complications: Secondary | ICD-10-CM | POA: Diagnosis not present

## 2021-02-12 ENCOUNTER — Encounter: Payer: Self-pay | Admitting: Cardiology

## 2021-02-12 ENCOUNTER — Other Ambulatory Visit: Payer: Self-pay

## 2021-02-12 ENCOUNTER — Ambulatory Visit (INDEPENDENT_AMBULATORY_CARE_PROVIDER_SITE_OTHER): Payer: Medicare HMO | Admitting: Cardiology

## 2021-02-12 VITALS — BP 124/68 | HR 79 | Ht 71.0 in | Wt 210.0 lb

## 2021-02-12 DIAGNOSIS — I483 Typical atrial flutter: Secondary | ICD-10-CM | POA: Diagnosis not present

## 2021-02-12 DIAGNOSIS — I2692 Saddle embolus of pulmonary artery without acute cor pulmonale: Secondary | ICD-10-CM | POA: Diagnosis not present

## 2021-02-12 NOTE — Patient Instructions (Addendum)
Medication Instructions:  Your physician recommends that you continue on your current medications as directed. Please refer to the Current Medication list given to you today.  Labwork: None ordered.  Testing/Procedures: None ordered.  Follow-Up:  Your physician wants you to follow-up in: 9 months with Dr. Quentin Ore.  You will receive a reminder letter in the mail two months in advance. If you don't receive a letter, please call our office to schedule the follow-up appointment.    Implantable Loop Recorder Placement, Care After This sheet gives you information about how to care for yourself after your procedure. Your health care provider may also give you more specific instructions. If you have problems or questions, contact your health care provider. What can I expect after the procedure? After the procedure, it is common to have: Soreness or discomfort near the incision. Some swelling or bruising near the incision.  Follow these instructions at home: Incision care   Leave your outer dressing on for 72 hours.  After 72 hours you can remove your outer dressing and shower. Leave adhesive strips in place. These skin closures may need to stay in place for 1-2 weeks. If adhesive strip edges start to loosen and curl up, you may trim the loose edges.  You may remove the strips if they have not fallen off after 2 weeks. Check your incision area every day for signs of infection. Check for: Redness, swelling, or pain. Fluid or blood. Warmth. Pus or a bad smell. Do not take baths, swim, or use a hot tub until your incision is completely healed. If your wound site starts to bleed apply pressure.      If you have any questions/concerns please call the device clinic at [phone].  Activity  Return to your normal activities.  General instructions Follow instructions from your health care provider about how to manage your implantable loop recorder and transmit the information. Learn how to  activate a recording if this is necessary for your type of device. You may go through a metal detection gate, and you may let someone hold a metal detector over your chest. Show your ID card. Do not have an MRI unless you check with your health care provider first. Take over-the-counter and prescription medicines only as told by your health care provider. Keep all follow-up visits as told by your health care provider. This is important. Contact a health care provider if: You have redness, swelling, or pain around your incision. You have a fever. You have pain that is not relieved by your pain medicine. You have triggered your device because of fainting (syncope) or because of a heartbeat that feels like it is racing, slow, fluttering, or skipping (palpitations). Get help right away if you have: Chest pain. Difficulty breathing. Summary After the procedure, it is common to have soreness or discomfort near the incision. Change your dressing as told by your health care provider. Follow instructions from your health care provider about how to manage your implantable loop recorder and transmit the information. Keep all follow-up visits as told by your health care provider. This is important. This information is not intended to replace advice given to you by your health care provider. Make sure you discuss any questions you have with your health care provider. Document Released: 06/08/2015 Document Revised: 08/12/2017 Document Reviewed: 08/12/2017 Elsevier Patient Education  2020 [employer].

## 2021-02-12 NOTE — Progress Notes (Signed)
Electrophysiology Office Follow up Visit Note:    Date:  02/12/2021   ID:  Bobby Fields, DOB 1950/11/02, MRN [id number]  PCP:  Nicoletta Dress, MD  [facility] Cardiologist:  Candee Furbish, MD  [facility] Electrophysiologist:  Vickie Epley, MD    Interval History:    [name] is a 70 y.o. male who presents for a follow up visit after an atrial flutter ablation on Nov 09, 2020.  He has done well after the ablation procedure.  He saw Adline Peals last on Dec 08, 2020.  At that appointment he reported no recurrence in his arrhythmia.  He takes Eliquis 5 mg twice daily for stroke prophylaxis.  He presents today for loop recorder implant for monitoring for AF.  No recurrence of arrhythmia since the ablation.  He continues to take his Eliquis without bleeding complications.   Past Medical History:  Diagnosis Date   Atrial flutter ([place])    Diabetes mellitus without complication ([place])    High cholesterol    Hypertension     Past Surgical History:  Procedure Laterality Date   A-FLUTTER ABLATION N/A 11/09/2020   Procedure: A-FLUTTER ABLATION;  Surgeon: Vickie Epley, MD;  Location: [place] [facility];  Service: Cardiovascular;  Laterality: N/A;   IR ANGIOGRAM PULMONARY BILATERAL SELECTIVE  08/15/2020   IR ANGIOGRAM SELECTIVE EACH ADDITIONAL VESSEL  08/15/2020   IR ANGIOGRAM SELECTIVE EACH ADDITIONAL VESSEL  08/15/2020   IR INFUSION THROMBOL ARTERIAL INITIAL (MS)  08/15/2020   IR INFUSION THROMBOL ARTERIAL INITIAL (MS)  08/15/2020   IR THROMB F/U EVAL ART/VEN FINAL DAY (MS)  08/16/2020    Current Medications: No outpatient medications have been marked as taking for the 02/12/21 encounter (Office Visit) with Vickie Epley, MD.     Allergies:   Indomethacin   Social History   Socioeconomic History   Marital status: Married    Spouse name: Not on file   Number of children: Not on file   Years of education: Not on file   Highest education level: Not on file  Occupational  History   Not on file  Tobacco Use   Smoking status: Never   Smokeless tobacco: Never  Substance and Sexual Activity   Alcohol use: Yes    Alcohol/week: 1.0 standard drink    Types: 1 Cans of beer per week    Comment: weekly   Drug use: Never   Sexual activity: Not on file  Other Topics Concern   Not on file  Social History Narrative   Not on file   Social Determinants of Health   Financial Resource Strain: Not on file  Food Insecurity: Not on file  Transportation Needs: Not on file  Physical Activity: Not on file  Stress: Not on file  Social Connections: Not on file     Family History: The patient's family history is not on file.  ROS:   Please see the history of present illness.    All other systems reviewed and are negative.  EKGs/Labs/Other Studies Reviewed:    The following studies were reviewed today: Ablation records  EKG:  The ekg ordered today demonstrates sinus rhythm, PAC, first-degree AV delay  Recent Labs: 08/14/2020: TSH 0.505 08/15/2020: Magnesium 2.0 11/16/2020: BUN 18; Creatinine, Ser 1.37; Hemoglobin 15.8; Platelets 252; Potassium 4.1; Sodium 135  Recent Lipid Panel No results found for: CHOL, TRIG, HDL, CHOLHDL, VLDL, LDLCALC, LDLDIRECT  Physical Exam:    VS:  BP 124/68   Pulse 79  Ht '5\' 11"'$  (1.803 m)   Wt 210 lb (95.3 kg)   BMI 29.29 kg/m     Wt Readings from Last 3 Encounters:  02/12/21 210 lb (95.3 kg)  12/08/20 202 lb 12.8 oz (92 kg)  11/16/20 205 lb 6.4 oz (93.2 kg)     GEN:  Well nourished, well developed in no acute distress HEENT: Normal NECK: No JVD; No carotid bruits LYMPHATICS: No lymphadenopathy CARDIAC: RRR, no murmurs, rubs, gallops RESPIRATORY:  Clear to auscultation without rales, wheezing or rhonchi  ABDOMEN: Soft, non-tender, non-distended MUSCULOSKELETAL:  No edema; No deformity  SKIN: Warm and dry NEUROLOGIC:  Alert and oriented x 3 PSYCHIATRIC:  Normal affect   ASSESSMENT:    1. Typical atrial flutter  ([place])   2. Acute saddle pulmonary embolism, unspecified whether acute cor pulmonale present (HCC)    PLAN:    In order of problems listed above:  1. Typical atrial flutter (HCC) Maintaining normal rhythm after his ablation.  On Eliquis for stroke prophylaxis.  We discussed using a loop recorder for ongoing surveillance for atrial fibrillation.  He would like to proceed with that today.  I discussed the risks and recovery time with the patient and his wife who is with him today.  From an atrial flutter perspective, he would be able to stop his Eliquis today but I would like him to continue it for now given his history of pulmonary embolism.  I will have him follow-up with his primary care physician to discuss how long he needs to remain on anticoagulation for the PE.  2. Acute saddle pulmonary embolism, unspecified whether acute cor pulmonale present (HCC) On Eliquis.  Follow-up with primary care physician.    Total time spent with patient today 35 minutes. This includes reviewing records, evaluating the patient and coordinating care.   Medication Adjustments/Labs and Tests Ordered: Current medicines are reviewed at length with the patient today.  Concerns regarding medicines are outlined above.  No orders of the defined types were placed in this encounter.  No orders of the defined types were placed in this encounter.    Signed, Lars Mage, MD, [facility], [facility] 02/12/2021 10:09 AM    Electrophysiology [facility]     -----------------------------------------------------------------------------------  SURGEON:  Lars Mage, MD    PREPROCEDURE DIAGNOSIS:  Atrial flutter    POSTPROCEDURE DIAGNOSIS:  Atrial flutter     PROCEDURES:   1. Implantable loop recorder implantation    INTRODUCTION:  Bobby Fields is a 70 y.o. male with a history of palpitations and atrial flutter who presents today for implantable loop implantation for ongoing  surveillance for atrial fibrillation given a history of atrial flutter ablation.    DESCRIPTION OF PROCEDURE:  Informed written consent was obtained.  The patient required no sedation for the procedure today.  Mapping over the patient's chest was performed to identify the area where electrograms were most prominent for ILR recording.  This area was found to be the left parasternal region over the 3rd-4th intercostal space. The patients left chest was therefore prepped and draped in the usual sterile fashion. The skin overlying the left parasternal region was infiltrated with lidocaine for local analgesia.  A 0.5-cm incision was made over the left parasternal region over the 3rd intercostal space.  A subcutaneous ILR pocket was fashioned using a combination of sharp and blunt dissection.  A Medtronic Reveal Linq model [id number])  implantable loop recorder was then placed into the pocket  R waves were  very prominent and measured >0.57m.  Steri- Strips and a sterile dressing were then applied.  There were no early apparent complications.     CONCLUSIONS:   1. Successful implantation of a Medtronic Reveal LINQ implantable loop recorder for palpitations and recurrent symptoms of atrial fibrillation  2. No early apparent complications.   CLars Mage MD 02/12/2021 10:11 AM

## 2021-03-22 ENCOUNTER — Ambulatory Visit (INDEPENDENT_AMBULATORY_CARE_PROVIDER_SITE_OTHER): Payer: Medicare HMO

## 2021-03-22 DIAGNOSIS — I483 Typical atrial flutter: Secondary | ICD-10-CM | POA: Diagnosis not present

## 2021-03-22 LAB — CUP PACEART REMOTE DEVICE CHECK
Date Time Interrogation Session: 20220911154656
Implantable Pulse Generator Implant Date: 20220805

## 2021-03-31 NOTE — Progress Notes (Signed)
Carelink Summary Report / Loop Recorder 

## 2021-04-26 ENCOUNTER — Ambulatory Visit (INDEPENDENT_AMBULATORY_CARE_PROVIDER_SITE_OTHER): Payer: Medicare HMO

## 2021-04-26 DIAGNOSIS — I483 Typical atrial flutter: Secondary | ICD-10-CM | POA: Diagnosis not present

## 2021-04-28 LAB — CUP PACEART REMOTE DEVICE CHECK
Date Time Interrogation Session: 20221014154252
Implantable Pulse Generator Implant Date: 20220805

## 2021-05-04 DIAGNOSIS — J101 Influenza due to other identified influenza virus with other respiratory manifestations: Secondary | ICD-10-CM | POA: Diagnosis not present

## 2021-05-04 DIAGNOSIS — R509 Fever, unspecified: Secondary | ICD-10-CM | POA: Diagnosis not present

## 2021-05-05 NOTE — Progress Notes (Signed)
Carelink Summary Report / Loop Recorder 

## 2021-05-11 DIAGNOSIS — L820 Inflamed seborrheic keratosis: Secondary | ICD-10-CM | POA: Diagnosis not present

## 2021-05-11 DIAGNOSIS — D2239 Melanocytic nevi of other parts of face: Secondary | ICD-10-CM | POA: Diagnosis not present

## 2021-05-11 DIAGNOSIS — L821 Other seborrheic keratosis: Secondary | ICD-10-CM | POA: Diagnosis not present

## 2021-05-11 DIAGNOSIS — D225 Melanocytic nevi of trunk: Secondary | ICD-10-CM | POA: Diagnosis not present

## 2021-05-11 DIAGNOSIS — L814 Other melanin hyperpigmentation: Secondary | ICD-10-CM | POA: Diagnosis not present

## 2021-05-24 ENCOUNTER — Other Ambulatory Visit (HOSPITAL_BASED_OUTPATIENT_CLINIC_OR_DEPARTMENT_OTHER): Payer: Self-pay

## 2021-05-24 DIAGNOSIS — I700 Atherosclerosis of aorta: Secondary | ICD-10-CM | POA: Diagnosis not present

## 2021-05-24 DIAGNOSIS — R911 Solitary pulmonary nodule: Secondary | ICD-10-CM | POA: Diagnosis not present

## 2021-05-24 DIAGNOSIS — R918 Other nonspecific abnormal finding of lung field: Secondary | ICD-10-CM | POA: Diagnosis not present

## 2021-05-27 LAB — CUP PACEART REMOTE DEVICE CHECK
Date Time Interrogation Session: 20221116154251
Implantable Pulse Generator Implant Date: 20220805

## 2021-05-29 DIAGNOSIS — M7989 Other specified soft tissue disorders: Secondary | ICD-10-CM | POA: Diagnosis not present

## 2021-05-29 DIAGNOSIS — L03116 Cellulitis of left lower limb: Secondary | ICD-10-CM | POA: Diagnosis not present

## 2021-05-29 DIAGNOSIS — M79605 Pain in left leg: Secondary | ICD-10-CM | POA: Diagnosis not present

## 2021-05-29 DIAGNOSIS — M79604 Pain in right leg: Secondary | ICD-10-CM | POA: Diagnosis not present

## 2021-05-29 DIAGNOSIS — M25572 Pain in left ankle and joints of left foot: Secondary | ICD-10-CM | POA: Diagnosis not present

## 2021-05-31 ENCOUNTER — Ambulatory Visit (INDEPENDENT_AMBULATORY_CARE_PROVIDER_SITE_OTHER): Payer: Medicare HMO

## 2021-05-31 DIAGNOSIS — I483 Typical atrial flutter: Secondary | ICD-10-CM

## 2021-06-09 NOTE — Progress Notes (Signed)
Carelink Summary Report / Loop Recorder 

## 2021-06-16 DIAGNOSIS — E1142 Type 2 diabetes mellitus with diabetic polyneuropathy: Secondary | ICD-10-CM | POA: Diagnosis not present

## 2021-06-16 DIAGNOSIS — Z125 Encounter for screening for malignant neoplasm of prostate: Secondary | ICD-10-CM | POA: Diagnosis not present

## 2021-06-16 DIAGNOSIS — E559 Vitamin D deficiency, unspecified: Secondary | ICD-10-CM | POA: Diagnosis not present

## 2021-06-16 DIAGNOSIS — I129 Hypertensive chronic kidney disease with stage 1 through stage 4 chronic kidney disease, or unspecified chronic kidney disease: Secondary | ICD-10-CM | POA: Diagnosis not present

## 2021-06-16 DIAGNOSIS — N183 Chronic kidney disease, stage 3 unspecified: Secondary | ICD-10-CM | POA: Diagnosis not present

## 2021-06-16 DIAGNOSIS — E785 Hyperlipidemia, unspecified: Secondary | ICD-10-CM | POA: Diagnosis not present

## 2021-06-17 ENCOUNTER — Telehealth: Payer: Self-pay | Admitting: Cardiology

## 2021-06-17 NOTE — Telephone Encounter (Signed)
Pt c/o medication issue:  1. Name of Medication: apixaban (ELIQUIS) 5 MG TABS tablet  2. How are you currently taking this medication (dosage and times per day)?   3. Are you having a reaction (difficulty breathing--STAT)?   4. What is your medication issue? PT WANTS TO KNOW HOW LONG HE WILL BE TAKING THIS MEDICINE.

## 2021-06-17 NOTE — Telephone Encounter (Signed)
Luiz Blare O at 06/17/2021 11:11 AM  Status: Signed  Pt c/o medication issue:   1. Name of Medication: apixaban (ELIQUIS) 5 MG TABS tablet   2. How are you currently taking this medication (dosage and times per day)?    3. Are you having a reaction (difficulty breathing--STAT)?    4. What is your medication issue? PT WANTS TO KNOW HOW LONG HE WILL BE TAKING THIS MEDICINE.

## 2021-06-17 NOTE — Telephone Encounter (Signed)
Duplicate phone message created.  Closing.

## 2021-06-17 NOTE — Telephone Encounter (Signed)
Returned call to Pt.  Per Pt-he was advised by Dr. Quentin Ore to discuss stopping Eliquis (for PE) with his PCP.  Per Dr. Glendell Docker an arrhythmia standpoint ok to stop Eliquis.  Per Pt his PCP said he would prefer Dr. Quentin Ore to decide on stopping Eliquis in relation to PE.  Dr. Quentin Ore aware and will advise.

## 2021-06-17 NOTE — Telephone Encounter (Signed)
Pt is calling to speak with Dr. Claudie Revering nurse.. no specific information given... please advise

## 2021-06-18 NOTE — Telephone Encounter (Signed)
Looks like the pulmonary/critical care note from his hospitalization says "After will need NoAC x 6 mo for provoked submassive". He is past that 6 month window now so should be OK to stop with close follow up with primary care physician.   Lysbeth Galas T. Quentin Ore, MD, [facility], [facility]  Cardiac Electrophysiology

## 2021-06-18 NOTE — Telephone Encounter (Signed)
Call placed to Pt.  Advised OK to stop Eliquis.  Await further needs.

## 2021-06-21 DIAGNOSIS — Z8719 Personal history of other diseases of the digestive system: Secondary | ICD-10-CM | POA: Diagnosis not present

## 2021-06-21 DIAGNOSIS — Z6828 Body mass index (BMI) 28.0-28.9, adult: Secondary | ICD-10-CM | POA: Diagnosis not present

## 2021-06-29 ENCOUNTER — Ambulatory Visit (INDEPENDENT_AMBULATORY_CARE_PROVIDER_SITE_OTHER): Payer: Medicare HMO

## 2021-06-29 DIAGNOSIS — I483 Typical atrial flutter: Secondary | ICD-10-CM | POA: Diagnosis not present

## 2021-06-29 LAB — CUP PACEART REMOTE DEVICE CHECK
Date Time Interrogation Session: 20221219154110
Implantable Pulse Generator Implant Date: 20220805

## 2021-07-08 NOTE — Progress Notes (Signed)
Carelink Summary Report / Loop Recorder 

## 2021-08-02 ENCOUNTER — Ambulatory Visit (INDEPENDENT_AMBULATORY_CARE_PROVIDER_SITE_OTHER): Payer: Medicare HMO

## 2021-08-02 DIAGNOSIS — I483 Typical atrial flutter: Secondary | ICD-10-CM | POA: Diagnosis not present

## 2021-08-02 LAB — CUP PACEART REMOTE DEVICE CHECK
Date Time Interrogation Session: 20230122231249
Implantable Pulse Generator Implant Date: 20220805

## 2021-08-13 NOTE — Progress Notes (Signed)
Carelink Summary Report / Loop Recorder 

## 2021-09-04 LAB — CUP PACEART REMOTE DEVICE CHECK
Date Time Interrogation Session: 20230224230835
Implantable Pulse Generator Implant Date: 20220805

## 2021-09-06 ENCOUNTER — Ambulatory Visit (INDEPENDENT_AMBULATORY_CARE_PROVIDER_SITE_OTHER): Payer: Medicare HMO

## 2021-09-06 DIAGNOSIS — I483 Typical atrial flutter: Secondary | ICD-10-CM

## 2021-09-13 NOTE — Progress Notes (Signed)
Carelink Summary Report / Loop Recorder 

## 2021-10-11 ENCOUNTER — Ambulatory Visit (INDEPENDENT_AMBULATORY_CARE_PROVIDER_SITE_OTHER): Payer: Medicare HMO

## 2021-10-11 DIAGNOSIS — I483 Typical atrial flutter: Secondary | ICD-10-CM | POA: Diagnosis not present

## 2021-10-12 LAB — CUP PACEART REMOTE DEVICE CHECK
Date Time Interrogation Session: 20230331230547
Implantable Pulse Generator Implant Date: 20220805

## 2021-10-26 NOTE — Progress Notes (Signed)
Carelink Summary Report / Loop Recorder 

## 2021-10-29 DIAGNOSIS — Z794 Long term (current) use of insulin: Secondary | ICD-10-CM | POA: Diagnosis not present

## 2021-10-29 DIAGNOSIS — D6869 Other thrombophilia: Secondary | ICD-10-CM | POA: Diagnosis not present

## 2021-10-29 DIAGNOSIS — I25119 Atherosclerotic heart disease of native coronary artery with unspecified angina pectoris: Secondary | ICD-10-CM | POA: Diagnosis not present

## 2021-10-29 DIAGNOSIS — I4891 Unspecified atrial fibrillation: Secondary | ICD-10-CM | POA: Diagnosis not present

## 2021-10-29 DIAGNOSIS — K219 Gastro-esophageal reflux disease without esophagitis: Secondary | ICD-10-CM | POA: Diagnosis not present

## 2021-10-29 DIAGNOSIS — I4892 Unspecified atrial flutter: Secondary | ICD-10-CM | POA: Diagnosis not present

## 2021-10-29 DIAGNOSIS — I272 Pulmonary hypertension, unspecified: Secondary | ICD-10-CM | POA: Diagnosis not present

## 2021-10-29 DIAGNOSIS — I951 Orthostatic hypotension: Secondary | ICD-10-CM | POA: Diagnosis not present

## 2021-10-29 DIAGNOSIS — I10 Essential (primary) hypertension: Secondary | ICD-10-CM | POA: Diagnosis not present

## 2021-10-29 DIAGNOSIS — E1151 Type 2 diabetes mellitus with diabetic peripheral angiopathy without gangrene: Secondary | ICD-10-CM | POA: Diagnosis not present

## 2021-10-29 DIAGNOSIS — E785 Hyperlipidemia, unspecified: Secondary | ICD-10-CM | POA: Diagnosis not present

## 2021-10-29 DIAGNOSIS — E1140 Type 2 diabetes mellitus with diabetic neuropathy, unspecified: Secondary | ICD-10-CM | POA: Diagnosis not present

## 2021-11-11 LAB — CUP PACEART REMOTE DEVICE CHECK
Date Time Interrogation Session: 20230503231300
Implantable Pulse Generator Implant Date: 20220805

## 2021-11-15 ENCOUNTER — Ambulatory Visit (INDEPENDENT_AMBULATORY_CARE_PROVIDER_SITE_OTHER): Payer: Medicare HMO

## 2021-11-15 DIAGNOSIS — I483 Typical atrial flutter: Secondary | ICD-10-CM | POA: Diagnosis not present

## 2021-11-30 ENCOUNTER — Ambulatory Visit (INDEPENDENT_AMBULATORY_CARE_PROVIDER_SITE_OTHER): Payer: Medicare HMO | Admitting: Cardiology

## 2021-11-30 ENCOUNTER — Encounter: Payer: Self-pay | Admitting: Cardiology

## 2021-11-30 VITALS — BP 116/74 | HR 70 | Ht 71.0 in | Wt 207.0 lb

## 2021-11-30 DIAGNOSIS — I2692 Saddle embolus of pulmonary artery without acute cor pulmonale: Secondary | ICD-10-CM

## 2021-11-30 DIAGNOSIS — I483 Typical atrial flutter: Secondary | ICD-10-CM

## 2021-11-30 NOTE — Progress Notes (Signed)
Electrophysiology Office Follow up Visit Note:    Date:  11/30/2021   ID:  Bobby Fields, DOB [birth year], MRN [id number]  PCP:  Nicoletta Dress, MD  [facility] Cardiologist:  Candee Furbish, MD  [facility] Electrophysiologist:  Vickie Epley, MD    Interval History:    Bobby Fields is a 71 y.o. male who presents for a follow up visit for MDT ILR device check. They were last seen in clinic 02/12/2021 at which time his ILR was implanted.  Since their last appointment, their most recent remote device check was on 11/10/2021 which showed no arrhythmias and a normal histogram. No AF burden.  He is accompanied by a family member. Overall, he is feeling okay. 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 He denies feeling any shortness of breath and is keeping up with his daily routines.  He denies any palpitations, chest pain, or peripheral edema. No lightheadedness, headaches, syncope, orthopnea, or PND.      Past Medical History:  Diagnosis Date   Atrial flutter ([place])    Diabetes mellitus without complication ([place])    High cholesterol    Hypertension     Past Surgical History:  Procedure Laterality Date   A-FLUTTER ABLATION N/A 11/09/2020   Procedure: A-FLUTTER ABLATION;  Surgeon: Vickie Epley, MD;  Location: [place] [facility];  Service: Cardiovascular;  Laterality: N/A;   IR ANGIOGRAM PULMONARY BILATERAL SELECTIVE  08/15/2020   IR ANGIOGRAM SELECTIVE EACH ADDITIONAL VESSEL  08/15/2020   IR ANGIOGRAM SELECTIVE EACH ADDITIONAL VESSEL  08/15/2020   IR INFUSION THROMBOL ARTERIAL INITIAL (MS)  08/15/2020   IR INFUSION THROMBOL ARTERIAL INITIAL (MS)  08/15/2020   IR THROMB F/U EVAL ART/VEN FINAL DAY (MS)  08/16/2020    Current Medications: Current Meds  Medication Sig   acetaminophen (TYLENOL) 500 MG tablet Take 500-1,000 mg by mouth every 6 (six) hours as needed (pain).   aspirin EC 81 MG tablet    BD PEN NEEDLE NANO  U/F 32G X 4 MM MISC Once per day   Calcium Carb-Cholecalciferol 600-800 MG-UNIT TABS Take 1 tablet by mouth 2 (two) times daily.   cetirizine (ZYRTEC) 10 MG tablet Take 10 mg by mouth daily.   ezetimibe (ZETIA) 10 MG tablet Take 10 mg by mouth in the morning.   glimepiride (AMARYL) 4 MG tablet Take 4 mg by mouth in the morning.   JANUVIA 100 MG tablet Take 100 mg by mouth in the morning.   Lancets (ONETOUCH DELICA PLUS [id number]) MISC Apply topically daily.   lisinopril (ZESTRIL) 5 MG tablet Take 5 mg by mouth at bedtime.   omeprazole (PRILOSEC) 20 MG capsule Take 20 mg by mouth in the morning.   ONETOUCH VERIO test strip 3 (three) times daily.   pravastatin (PRAVACHOL) 20 MG tablet Take 20 mg by mouth every Sunday.   TRESIBA FLEXTOUCH 100 UNIT/ML FlexTouch Pen Inject 20 Units into the skin at bedtime.     Allergies:   Indomethacin   Social History   Socioeconomic History   Marital status: Married    Spouse name: Not on file   Number of children: Not on file   Years of education: Not on file   Highest education level: Not on file  Occupational History   Not on file  Tobacco Use   Smoking status: Never   Smokeless tobacco: Never  Substance and Sexual Activity   Alcohol  use: Yes    Alcohol/week: 1.0 standard drink    Types: 1 Cans of beer per week    Comment: weekly   Drug use: Never   Sexual activity: Not on file  Other Topics Concern   Not on file  Social History Narrative   Not on file   Social Determinants of Health   Financial Resource Strain: Not on file  Food Insecurity: Not on file  Transportation Needs: Not on file  Physical Activity: Not on file  Stress: Not on file  Social Connections: Not on file     Family History: The patient's family history is not on file.  ROS:   Please see the history of present illness.    All other systems reviewed and are negative.  EKGs/Labs/Other Studies Reviewed:    The following studies were reviewed today: Ablation  records    11/09/2020  A-Flutter Ablation CONCLUSIONS:   1. Isthmus-dependent counter clockwise right atrial flutter.   2. Successful radiofrequency ablation of atrial flutter along the cavotricuspid isthmus with complete bidirectional isthmus block achieved.   3. No inducible arrhythmias following ablation.   4. No early apparent complications.   09/22/2020  Echo Sonographer Comments: Technically difficult study due to poor echo windows and suboptimal apical window.  IMPRESSIONS    1. Compared with the echo 08/2020 the right ventricle is less dilated and  systolic function has improved but not normalized.   2. Left ventricular ejection fraction, by estimation, is 55 to 60%. The  left ventricle has normal function. Left ventricular diastolic parameters  are indeterminate.   3. Right ventricular systolic function is mildly reduced. The right  ventricular size is normal. There is normal pulmonary artery systolic  pressure.   4. The aortic valve is calcified. There is moderate calcification of the  aortic valve. There is moderate thickening of the aortic valve.   5. Aortic dilatation noted. There is mild dilatation of the aortic root,  measuring 37 mm.   6. The inferior vena cava is normal in size with greater than 50%  respiratory variability, suggesting right atrial pressure of 3 mmHg.   Comparison(s): 08/14/20 EF 65-70%.   09/20/2020  CTA Chest  FINDINGS: Cardiovascular: This is a technically adequate evaluation of the pulmonary vasculature. There are no acute pulmonary emboli. There has been complete resorption of the bilateral pulmonary emboli and saddle embolus seen previously.   The heart is unremarkable without pericardial effusion. Normal caliber of the thoracic aorta. Stable atherosclerosis of the coronary vasculature.   Mediastinum/Nodes: No enlarged mediastinal, hilar, or axillary lymph nodes. Thyroid gland, trachea, and esophagus demonstrate no significant findings.    Lungs/Pleura: No acute airspace disease, effusion, or pneumothorax. Right lower lobe subpleural pulmonary nodules are again identified and stable. Nodules are as follows:   Right lower lobe, image 74/6, 4 mm.   Right lower lobe, image 79/6, 6 mm.   Right lower lobe, image 82/6, 4 mm.   Upper Abdomen: No acute abnormality.   Musculoskeletal: No acute or destructive bony lesions. Reconstructed images demonstrate no additional findings.   Review of the MIP images confirms the above findings.   IMPRESSION: 1. No acute pulmonary emboli. Complete resorption of the bilateral pulmonary emboli seen previously. 2. Stable subpleural right lower lobe pulmonary nodules, largest 6 mm on today's study unchanged since prior exam by my measurement. Non-contrast chest CT at 3-6 months is recommended. If the nodules are stable at time of repeat CT, then future CT at 18-24 months (from  today's scan) is considered optional for low-risk patients, but is recommended for high-risk patients. This recommendation follows the consensus statement: Guidelines for Management of Incidental Pulmonary Nodules Detected on CT Images: From the Fleischner Society 2017; Radiology 2017; 284:228-243. 3.  Aortic Atherosclerosis (ICD10-I70.0).   EKG:  EKG is personally reviewed.  11/30/2021: Sinus rhythm.  First-degree AV delay. 02/12/2021: Sinus rhythm, PAC, first-degree AV delay  Recent Labs: No results found for requested labs within last 8760 hours.   Recent Lipid Panel No results found for: CHOL, TRIG, HDL, CHOLHDL, VLDL, LDLCALC, LDLDIRECT  Physical Exam:    VS:  BP 116/74   Pulse 70   Ht '5\' 11"'$  (1.803 m)   Wt 207 lb (93.9 kg)   SpO2 97%   BMI 28.87 kg/m     Wt Readings from Last 3 Encounters:  11/30/21 207 lb (93.9 kg)  02/12/21 210 lb (95.3 kg)  12/08/20 202 lb 12.8 oz (92 kg)     GEN: Well nourished, well developed in no acute distress HEENT: Normal NECK: No JVD; No carotid  bruits LYMPHATICS: No lymphadenopathy CARDIAC: RRR, no murmurs, rubs, gallops; Device pocket well healed. RESPIRATORY:  Clear to auscultation without rales, wheezing or rhonchi  ABDOMEN: Soft, non-tender, non-distended MUSCULOSKELETAL:  No edema; No deformity  SKIN: Warm and dry NEUROLOGIC:  Alert and oriented x 3 PSYCHIATRIC:  Normal affect        ASSESSMENT:    1. Typical atrial flutter ([place])   2. Acute saddle pulmonary embolism, unspecified whether acute cor pulmonale present (HCC)    PLAN:    In order of problems listed above:  #Typical atrial flutter Maintaining sinus rhythm after his ablation.  Continue monitoring using the loop recorder.  He takes aspirin 81 mg mouth once daily.  He is off full anticoagulation.  #History of PE He has been taken off of his anticoagulation by his primary care physician.  He takes aspirin 81 mg by mouth once daily. He follows with his primary care physician for this.   Follow-up in 1 year with APP.  Medication Adjustments/Labs and Tests Ordered: Current medicines are reviewed at length with the patient today.  Concerns regarding medicines are outlined above.  Orders Placed This Encounter  Procedures   EKG 12-Lead   No orders of the defined types were placed in this encounter.   I,Mathew Stumpf,acting as a Education administrator for Vickie Epley, MD.,have documented all relevant documentation on the behalf of Vickie Epley, MD,as directed by  Vickie Epley, MD while in the presence of Vickie Epley, MD.  I, Vickie Epley, MD, have reviewed all documentation for this visit. The documentation on 11/30/21 for the exam, diagnosis, procedures, and orders are all accurate and complete.   Signed, Lars Mage, MD, [facility], [facility] 11/30/2021 2:37 PM    Electrophysiology [facility]

## 2021-11-30 NOTE — Patient Instructions (Signed)

## 2021-12-03 NOTE — Progress Notes (Signed)
Carelink Summary Report / Loop Recorder 

## 2021-12-15 DIAGNOSIS — N183 Chronic kidney disease, stage 3 unspecified: Secondary | ICD-10-CM | POA: Diagnosis not present

## 2021-12-15 DIAGNOSIS — E785 Hyperlipidemia, unspecified: Secondary | ICD-10-CM | POA: Diagnosis not present

## 2021-12-15 DIAGNOSIS — E1142 Type 2 diabetes mellitus with diabetic polyneuropathy: Secondary | ICD-10-CM | POA: Diagnosis not present

## 2021-12-15 DIAGNOSIS — E559 Vitamin D deficiency, unspecified: Secondary | ICD-10-CM | POA: Diagnosis not present

## 2021-12-17 DIAGNOSIS — I129 Hypertensive chronic kidney disease with stage 1 through stage 4 chronic kidney disease, or unspecified chronic kidney disease: Secondary | ICD-10-CM | POA: Diagnosis not present

## 2021-12-17 DIAGNOSIS — E559 Vitamin D deficiency, unspecified: Secondary | ICD-10-CM | POA: Diagnosis not present

## 2021-12-17 DIAGNOSIS — Z6837 Body mass index (BMI) 37.0-37.9, adult: Secondary | ICD-10-CM | POA: Diagnosis not present

## 2021-12-17 DIAGNOSIS — E785 Hyperlipidemia, unspecified: Secondary | ICD-10-CM | POA: Diagnosis not present

## 2021-12-17 DIAGNOSIS — N183 Chronic kidney disease, stage 3 unspecified: Secondary | ICD-10-CM | POA: Diagnosis not present

## 2021-12-17 DIAGNOSIS — E1142 Type 2 diabetes mellitus with diabetic polyneuropathy: Secondary | ICD-10-CM | POA: Diagnosis not present

## 2021-12-20 ENCOUNTER — Ambulatory Visit (INDEPENDENT_AMBULATORY_CARE_PROVIDER_SITE_OTHER): Payer: Medicare HMO

## 2021-12-20 DIAGNOSIS — I4892 Unspecified atrial flutter: Secondary | ICD-10-CM

## 2021-12-22 LAB — CUP PACEART REMOTE DEVICE CHECK
Date Time Interrogation Session: 20230605231719
Implantable Pulse Generator Implant Date: 20220805

## 2022-01-05 NOTE — Progress Notes (Signed)
Carelink Summary Report / Loop Recorder 

## 2022-01-18 IMAGING — DX DG CHEST 2V
2 series · 2 of 2 positions shown · non-contrast
Comparison: August 16, 2020

CLINICAL DATA: Left-sided chest burning and pain under the left
arm.

EXAM:
CHEST - 2 VIEW

[chest pa]
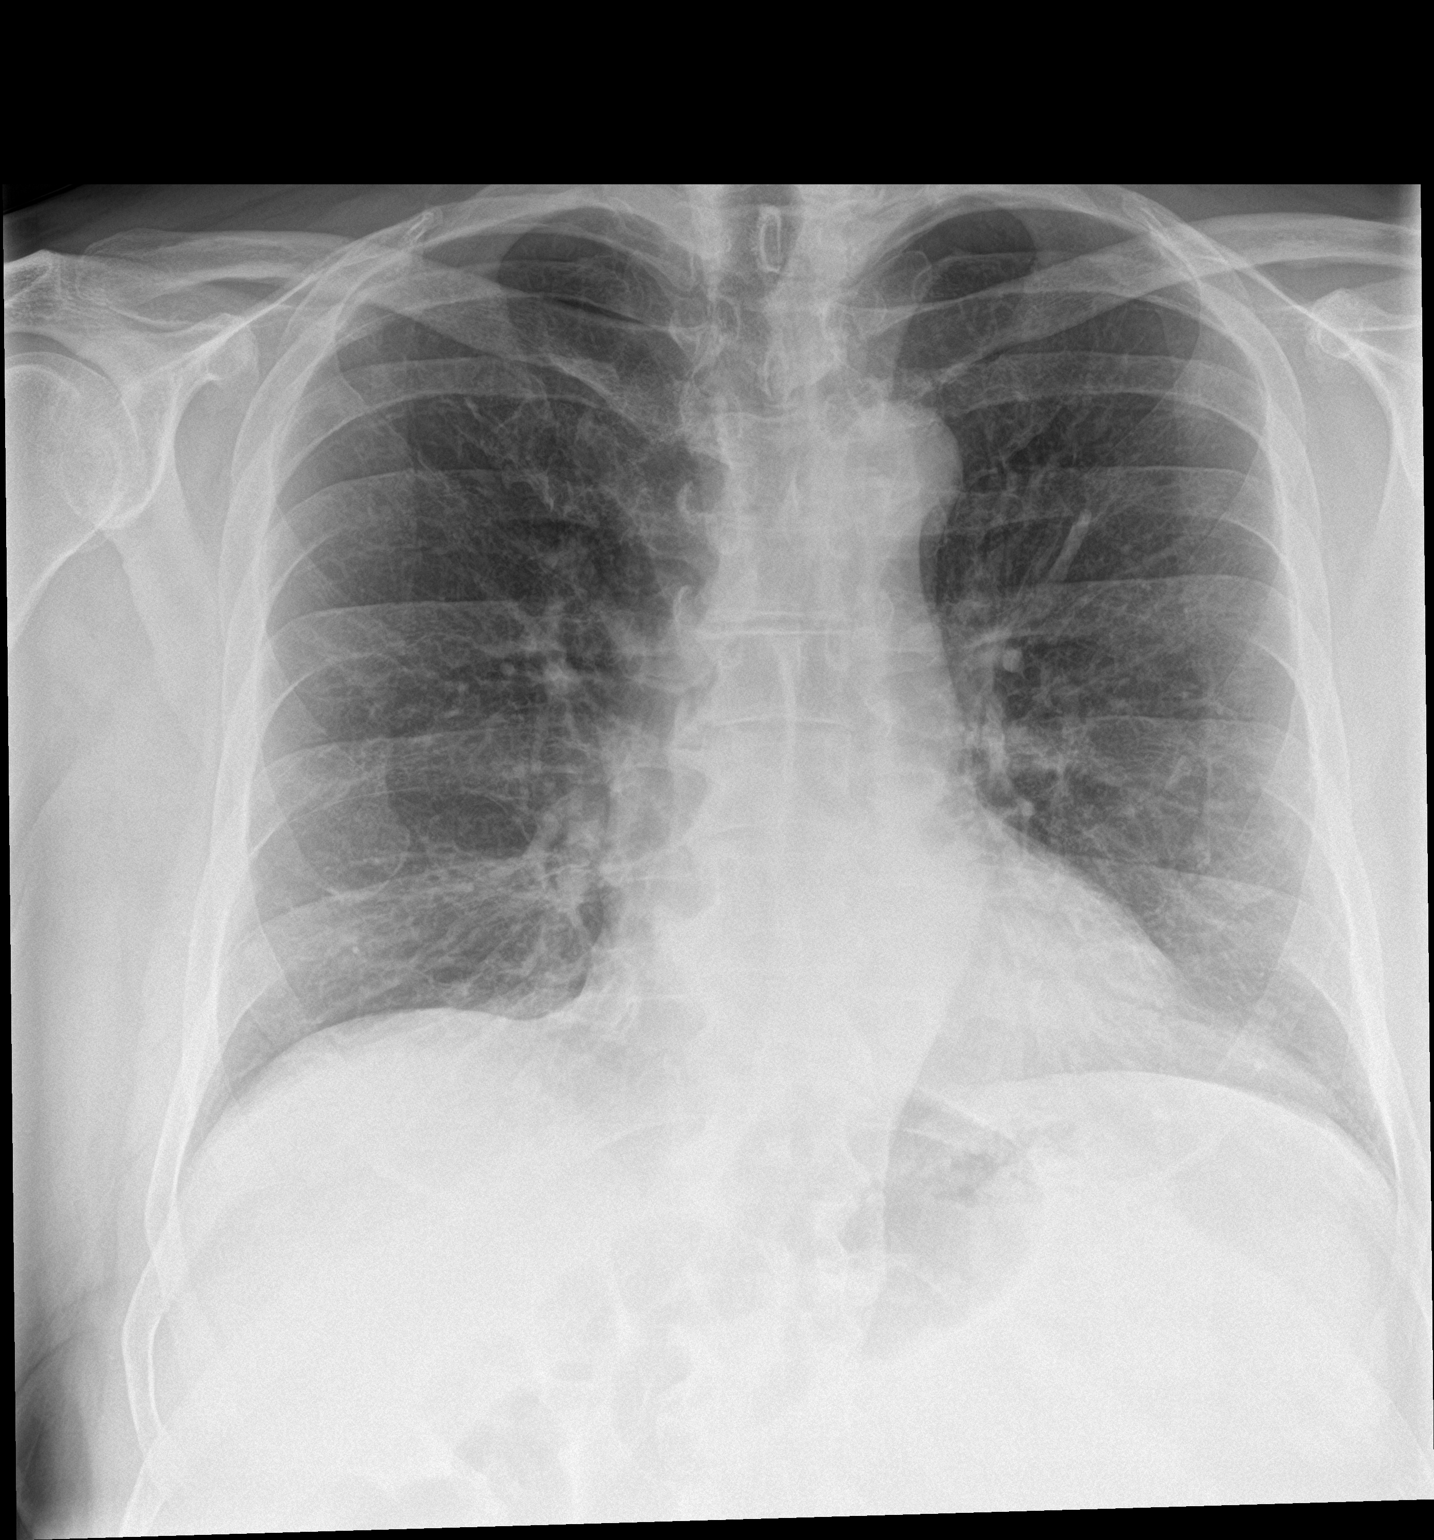

[chest lat]
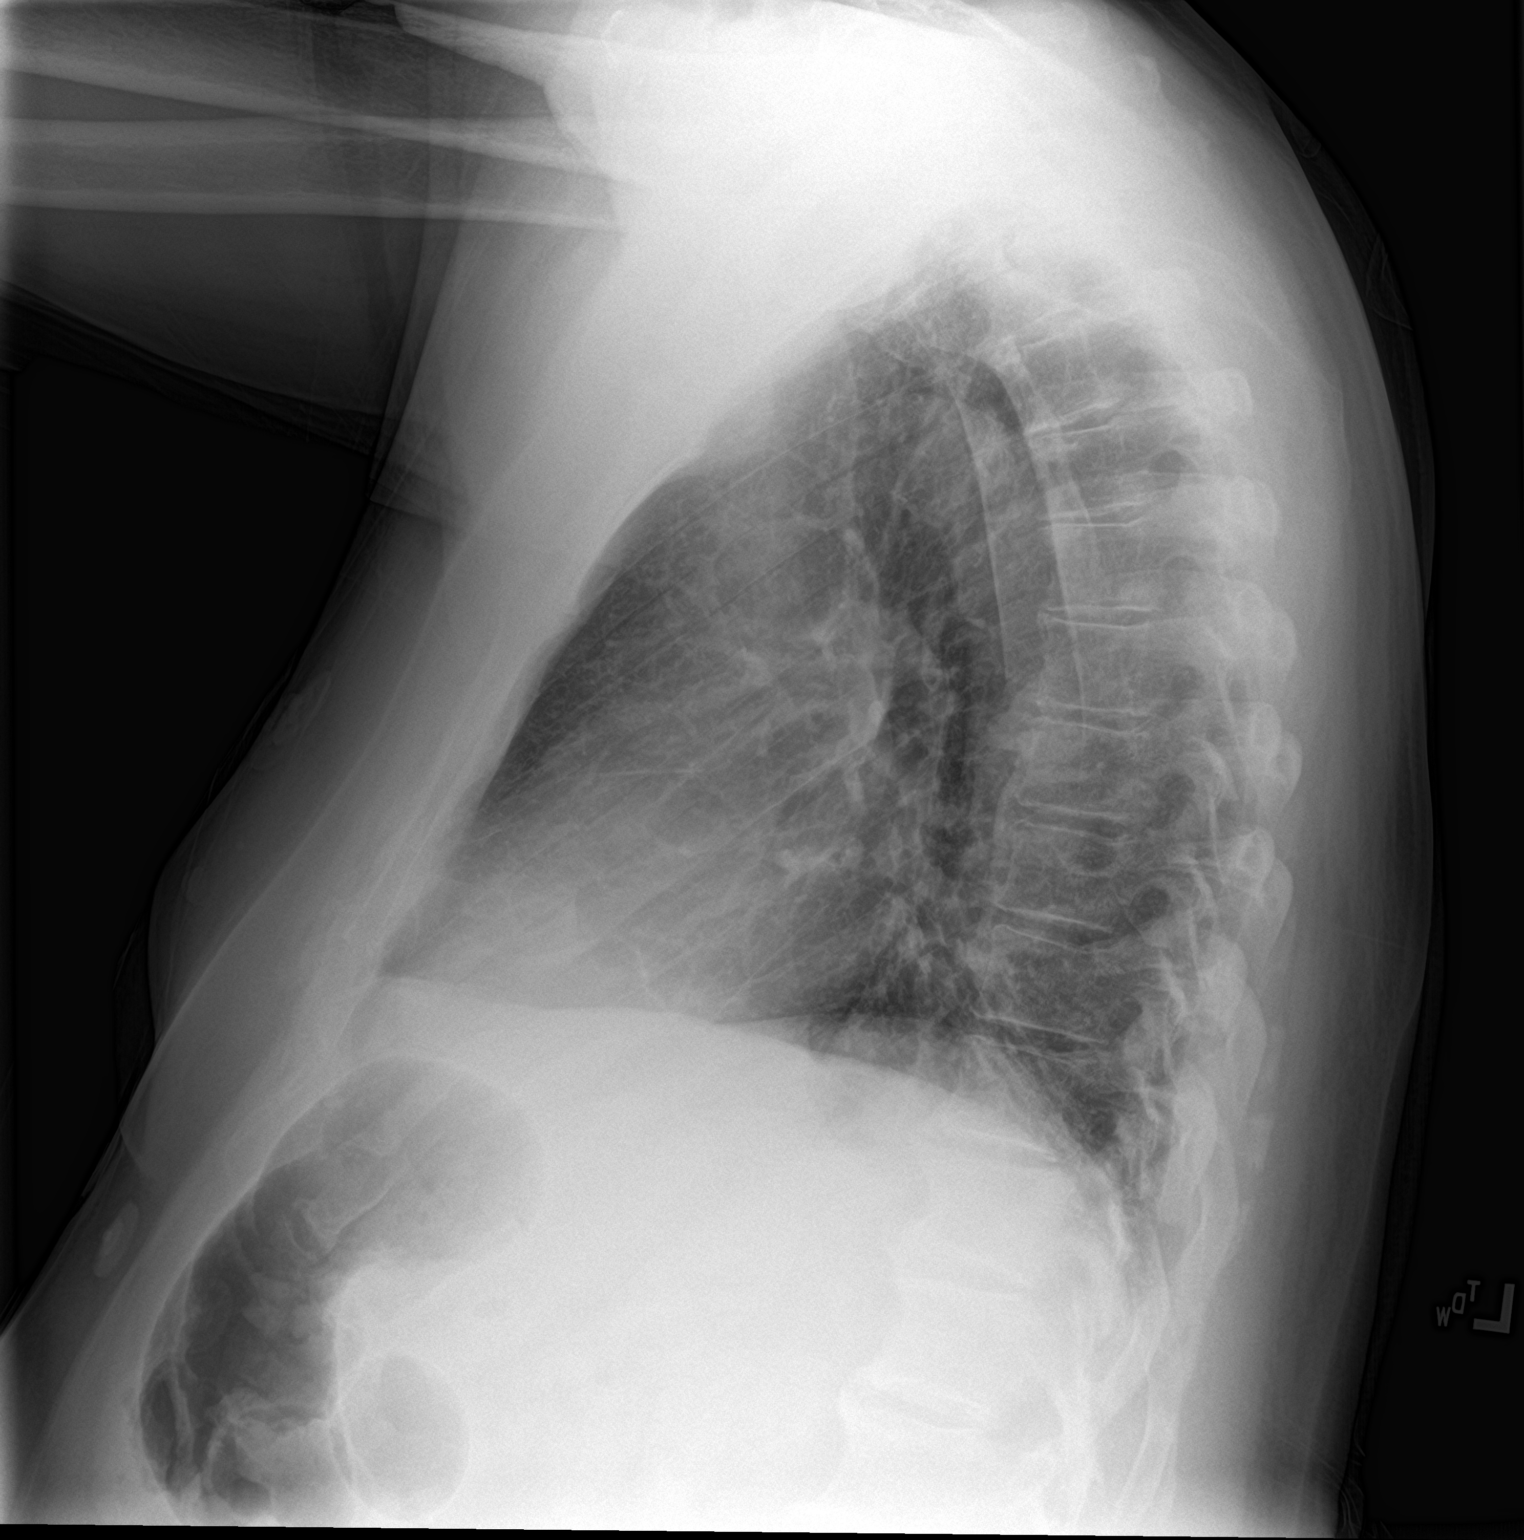

[2 of 2 positions shown; findings below may reference images not displayed]

FINDINGS: Mildly decreased lung volumes are seen likely secondary to the
degree of patient inspiration. There is no evidence of acute
infiltrate, pleural effusion or pneumothorax. The heart size and
mediastinal contours are within normal limits. The pulmonary artery
lysis catheter seen on the prior study has been removed.
Degenerative changes are seen within the thoracic spine.
IMPRESSION: No active cardiopulmonary disease.

## 2022-01-18 IMAGING — CT CT ANGIO CHEST
2 of 6 series · 17 of 46 positions shown · IV contrast (APPLIED)
Comparison: 08/14/2020

CLINICAL DATA: Left-sided chest burning, left arm pain, history of
recent pulmonary embolus

EXAM:
CT ANGIOGRAPHY CHEST WITH CONTRAST
TECHNIQUE: Multidetector CT imaging of the chest was performed using the
standard protocol during bolus administration of intravenous
contrast. Multiplanar CT image reconstructions and MIPs were
obtained to evaluate the vascular anatomy.
CONTRAST:  80mL OMNIPAQUE IOHEXOL 350 MG/ML SOLN

[Series 7: thins · axial · 0.82mm/px · z∈[+1162,+1424]mm · 14 of 410 slices shown]
[im 18/410  lung]
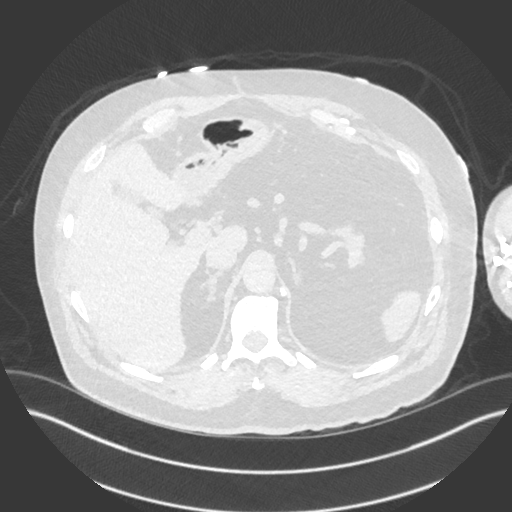
[im 54/410  soft-tissue]
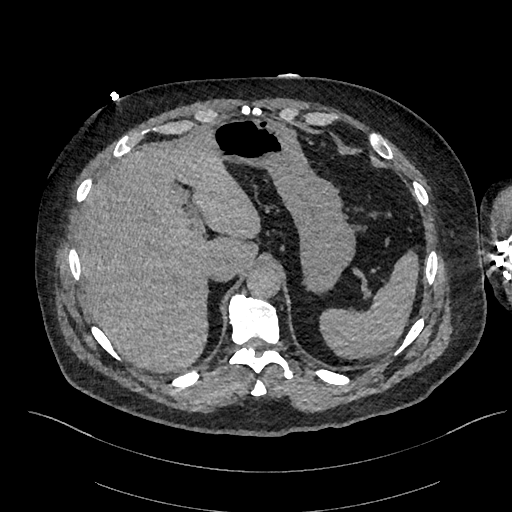
[im 72/410  lung]
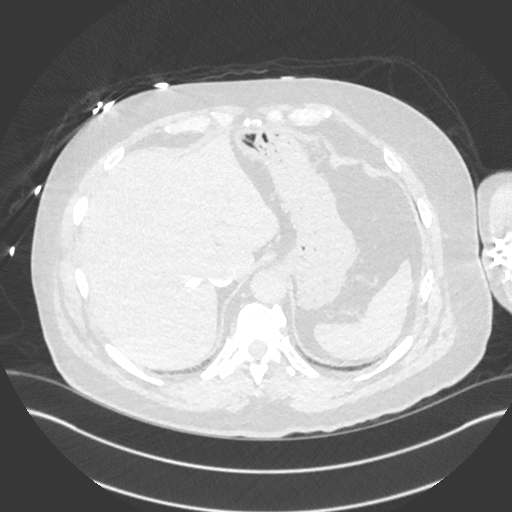
[im 107/410  soft-tissue]
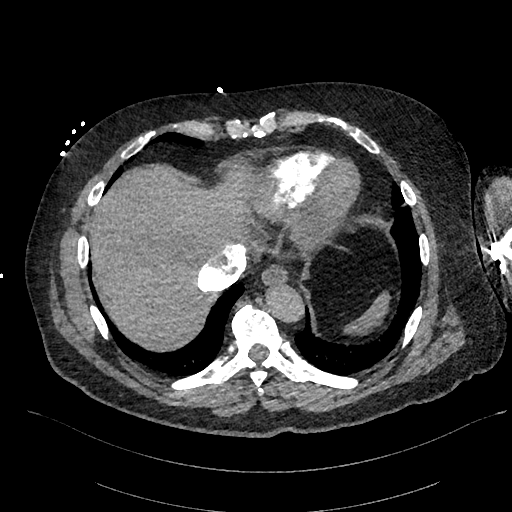
[im 143/410  lung]
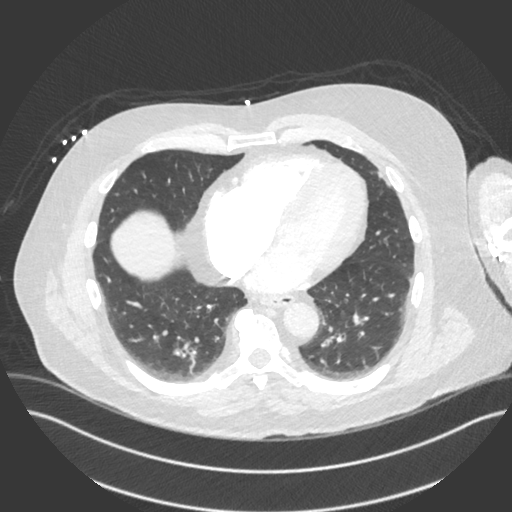
[im 161/410  soft-tissue]
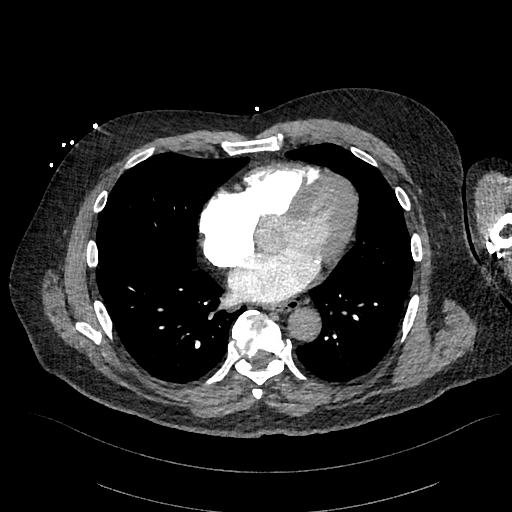
[im 196/410  lung]
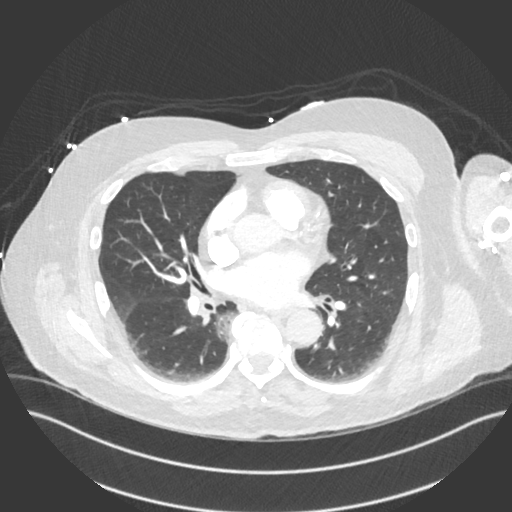
[im 214/410  soft-tissue]
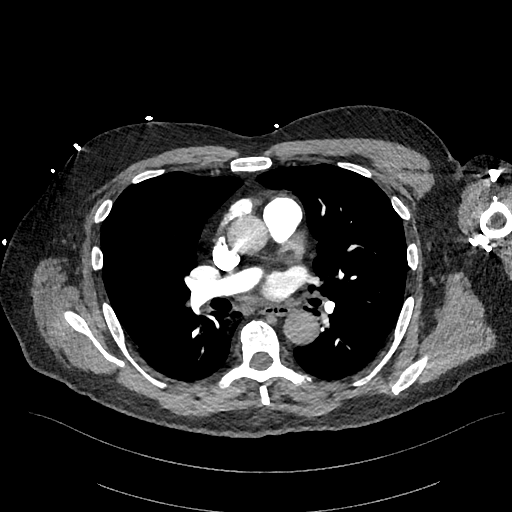
[im 249/410  lung]
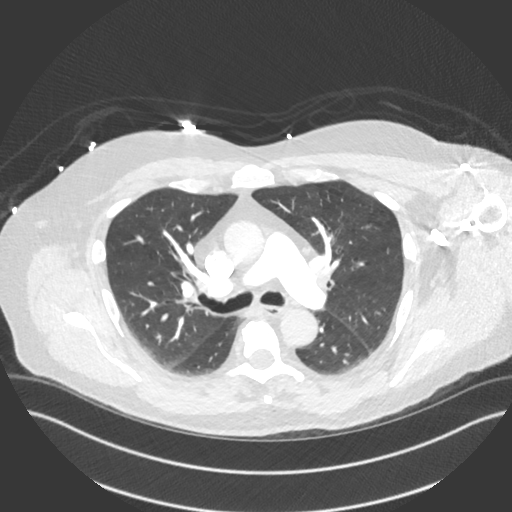
[im 267/410  soft-tissue]
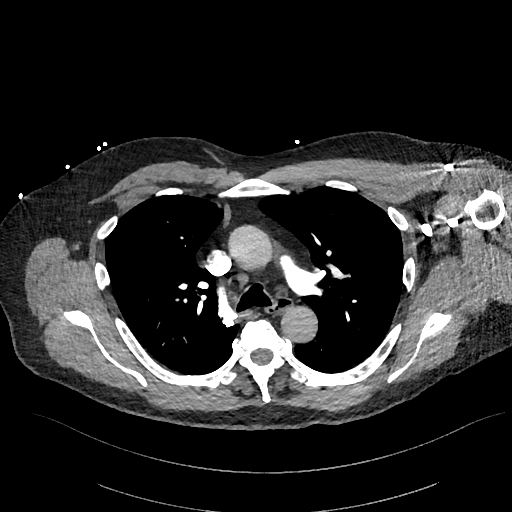
[im 303/410  lung]
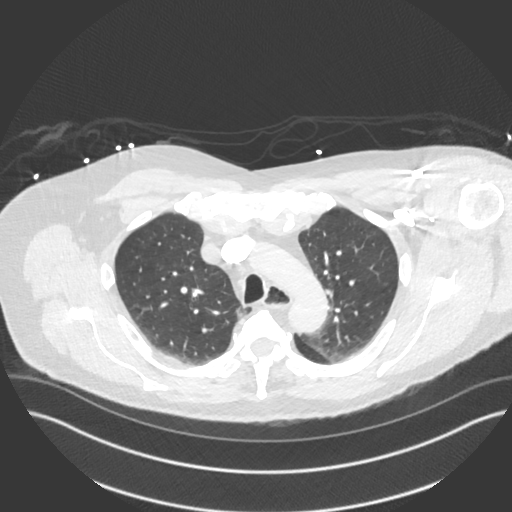
[im 338/410  soft-tissue]
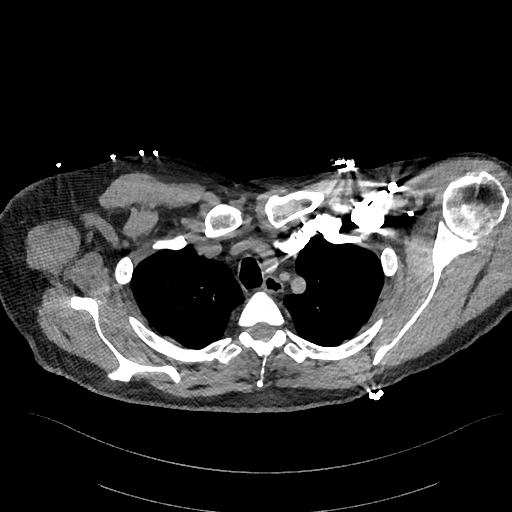
[im 356/410  lung]
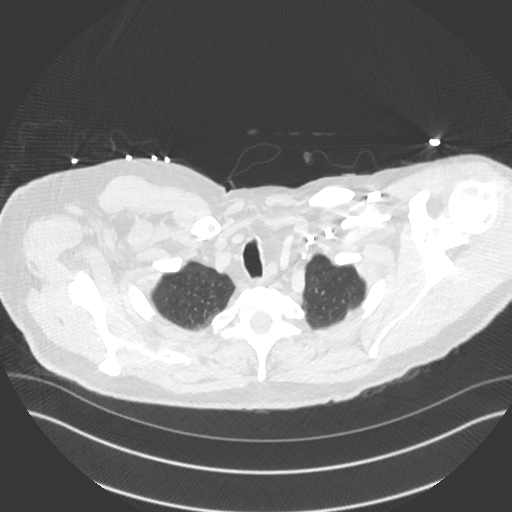
[im 392/410  soft-tissue]
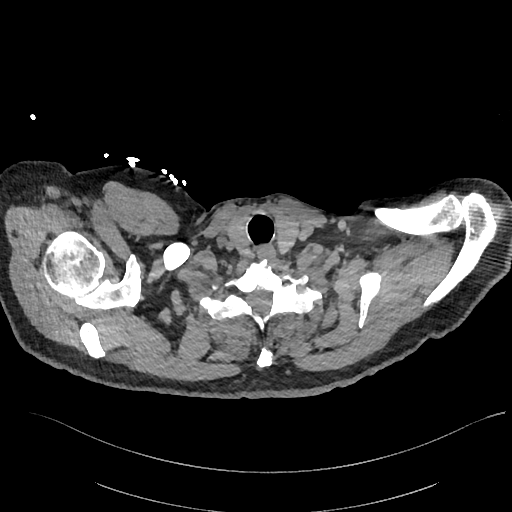

[Series 8: cor · coronal · 0.55mm/px · 3 of 147 slices shown]
[im 37/147  soft-tissue]
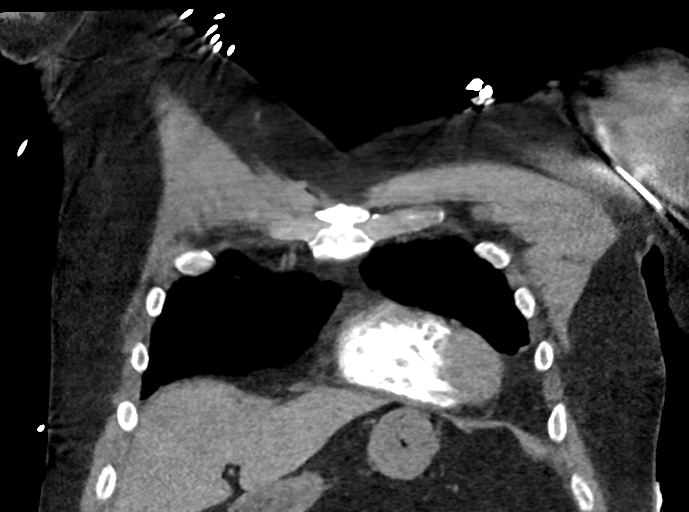
[im 74/147  soft-tissue]
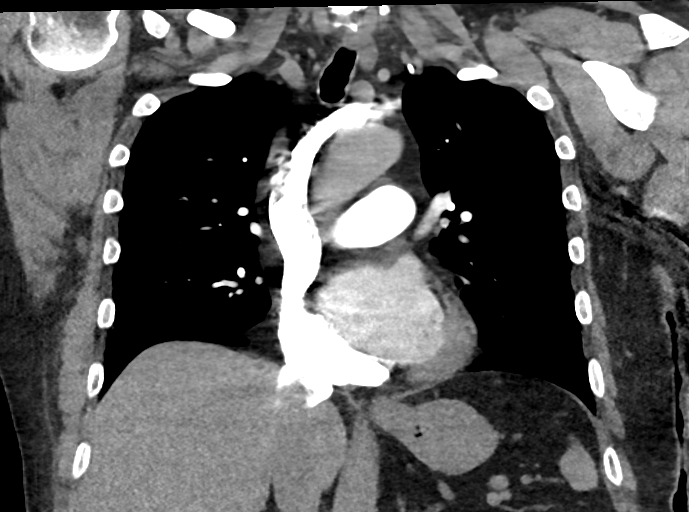
[im 110/147  soft-tissue]
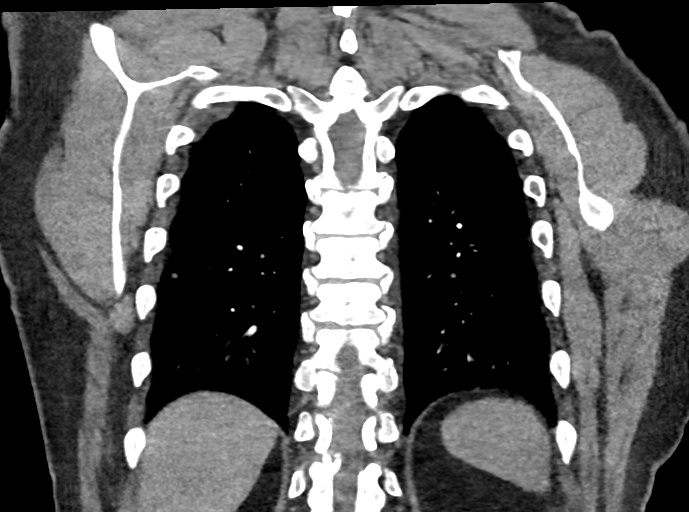

[17 of 46 positions shown; findings below may reference images not displayed]

FINDINGS: Cardiovascular: This is a technically adequate evaluation of the
pulmonary vasculature. There are no acute pulmonary emboli. There
has been complete resorption of the bilateral pulmonary emboli and
saddle embolus seen previously.

The heart is unremarkable without pericardial effusion. Normal
caliber of the thoracic aorta. Stable atherosclerosis of the
coronary vasculature.

Mediastinum/Nodes: No enlarged mediastinal, hilar, or axillary lymph
nodes. Thyroid gland, trachea, and esophagus demonstrate no
significant findings.

Lungs/Pleura: No acute airspace disease, effusion, or pneumothorax.
Right lower lobe subpleural pulmonary nodules are again identified
and stable. Nodules are as follows:

Right lower lobe, image 74/6, 4 mm.

Right lower lobe, image 79/6, 6 mm.

Right lower lobe, image 82/6, 4 mm.

Upper Abdomen: No acute abnormality.

Musculoskeletal: No acute or destructive bony lesions. Reconstructed
images demonstrate no additional findings.

Review of the MIP images confirms the above findings.
IMPRESSION: 1. No acute pulmonary emboli. Complete resorption of the bilateral
pulmonary emboli seen previously.
2. Stable subpleural right lower lobe pulmonary nodules, largest 6
mm on today's study unchanged since prior exam by my measurement.
Non-contrast chest CT at 3-6 months is recommended. If the nodules
are stable at time of repeat CT, then future CT at 18-24 months
(from today's scan) is considered optional for low-risk patients,
but is recommended for high-risk patients. This recommendation
follows the consensus statement: Guidelines for Management of
Incidental Pulmonary Nodules Detected on CT Images: From the
3.  Aortic Atherosclerosis ([id number]).

## 2022-01-20 LAB — CUP PACEART REMOTE DEVICE CHECK
Date Time Interrogation Session: 20230708230613
Implantable Pulse Generator Implant Date: 20220805

## 2022-01-24 ENCOUNTER — Ambulatory Visit (INDEPENDENT_AMBULATORY_CARE_PROVIDER_SITE_OTHER): Payer: Medicare HMO

## 2022-01-24 DIAGNOSIS — I4892 Unspecified atrial flutter: Secondary | ICD-10-CM | POA: Diagnosis not present

## 2022-02-24 NOTE — Progress Notes (Signed)
Carelink Summary Report / Loop Recorder 

## 2022-02-28 ENCOUNTER — Ambulatory Visit (INDEPENDENT_AMBULATORY_CARE_PROVIDER_SITE_OTHER): Payer: Medicare HMO

## 2022-02-28 DIAGNOSIS — I4892 Unspecified atrial flutter: Secondary | ICD-10-CM | POA: Diagnosis not present

## 2022-03-01 LAB — CUP PACEART REMOTE DEVICE CHECK
Date Time Interrogation Session: 20230820231212
Implantable Pulse Generator Implant Date: 20220805

## 2022-03-16 DIAGNOSIS — Z1331 Encounter for screening for depression: Secondary | ICD-10-CM | POA: Diagnosis not present

## 2022-03-16 DIAGNOSIS — Z0000 Encounter for general adult medical examination without abnormal findings: Secondary | ICD-10-CM | POA: Diagnosis not present

## 2022-03-16 DIAGNOSIS — E785 Hyperlipidemia, unspecified: Secondary | ICD-10-CM | POA: Diagnosis not present

## 2022-03-16 DIAGNOSIS — Z9181 History of falling: Secondary | ICD-10-CM | POA: Diagnosis not present

## 2022-03-28 NOTE — Progress Notes (Signed)
Carelink Summary Report / Loop Recorder 

## 2022-04-04 ENCOUNTER — Ambulatory Visit (INDEPENDENT_AMBULATORY_CARE_PROVIDER_SITE_OTHER): Payer: Medicare HMO

## 2022-04-04 DIAGNOSIS — I4892 Unspecified atrial flutter: Secondary | ICD-10-CM | POA: Diagnosis not present

## 2022-04-05 LAB — CUP PACEART REMOTE DEVICE CHECK
Date Time Interrogation Session: 20230922230618
Implantable Pulse Generator Implant Date: 20220805

## 2022-04-20 NOTE — Progress Notes (Signed)
Carelink Summary Report / Loop Recorder 

## 2022-05-09 ENCOUNTER — Ambulatory Visit (INDEPENDENT_AMBULATORY_CARE_PROVIDER_SITE_OTHER): Payer: Medicare HMO

## 2022-05-09 DIAGNOSIS — I4892 Unspecified atrial flutter: Secondary | ICD-10-CM | POA: Diagnosis not present

## 2022-05-10 DIAGNOSIS — E1142 Type 2 diabetes mellitus with diabetic polyneuropathy: Secondary | ICD-10-CM | POA: Diagnosis not present

## 2022-05-10 DIAGNOSIS — N183 Chronic kidney disease, stage 3 unspecified: Secondary | ICD-10-CM | POA: Diagnosis not present

## 2022-05-10 DIAGNOSIS — I10 Essential (primary) hypertension: Secondary | ICD-10-CM | POA: Diagnosis not present

## 2022-05-10 LAB — CUP PACEART REMOTE DEVICE CHECK
Date Time Interrogation Session: 20231025231307
Implantable Pulse Generator Implant Date: 20220805

## 2022-05-12 DIAGNOSIS — D225 Melanocytic nevi of trunk: Secondary | ICD-10-CM | POA: Diagnosis not present

## 2022-05-12 DIAGNOSIS — D2239 Melanocytic nevi of other parts of face: Secondary | ICD-10-CM | POA: Diagnosis not present

## 2022-05-12 DIAGNOSIS — L814 Other melanin hyperpigmentation: Secondary | ICD-10-CM | POA: Diagnosis not present

## 2022-05-12 DIAGNOSIS — L821 Other seborrheic keratosis: Secondary | ICD-10-CM | POA: Diagnosis not present

## 2022-05-19 DIAGNOSIS — Z8719 Personal history of other diseases of the digestive system: Secondary | ICD-10-CM | POA: Diagnosis not present

## 2022-05-19 DIAGNOSIS — R109 Unspecified abdominal pain: Secondary | ICD-10-CM | POA: Diagnosis not present

## 2022-05-25 DIAGNOSIS — R1031 Right lower quadrant pain: Secondary | ICD-10-CM | POA: Diagnosis not present

## 2022-05-25 DIAGNOSIS — N281 Cyst of kidney, acquired: Secondary | ICD-10-CM | POA: Diagnosis not present

## 2022-05-25 DIAGNOSIS — K8689 Other specified diseases of pancreas: Secondary | ICD-10-CM | POA: Diagnosis not present

## 2022-06-11 NOTE — Progress Notes (Signed)
Carelink Summary Report / Loop Recorder 

## 2022-06-13 ENCOUNTER — Ambulatory Visit (INDEPENDENT_AMBULATORY_CARE_PROVIDER_SITE_OTHER): Payer: Medicare HMO

## 2022-06-13 DIAGNOSIS — I4892 Unspecified atrial flutter: Secondary | ICD-10-CM | POA: Diagnosis not present

## 2022-06-13 LAB — CUP PACEART REMOTE DEVICE CHECK
Date Time Interrogation Session: 20231203231322
Implantable Pulse Generator Implant Date: 20220805

## 2022-06-20 DIAGNOSIS — E559 Vitamin D deficiency, unspecified: Secondary | ICD-10-CM | POA: Diagnosis not present

## 2022-06-20 DIAGNOSIS — E785 Hyperlipidemia, unspecified: Secondary | ICD-10-CM | POA: Diagnosis not present

## 2022-06-20 DIAGNOSIS — N183 Chronic kidney disease, stage 3 unspecified: Secondary | ICD-10-CM | POA: Diagnosis not present

## 2022-06-20 DIAGNOSIS — Z125 Encounter for screening for malignant neoplasm of prostate: Secondary | ICD-10-CM | POA: Diagnosis not present

## 2022-06-20 DIAGNOSIS — E1142 Type 2 diabetes mellitus with diabetic polyneuropathy: Secondary | ICD-10-CM | POA: Diagnosis not present

## 2022-06-23 DIAGNOSIS — E1142 Type 2 diabetes mellitus with diabetic polyneuropathy: Secondary | ICD-10-CM | POA: Diagnosis not present

## 2022-06-23 DIAGNOSIS — E785 Hyperlipidemia, unspecified: Secondary | ICD-10-CM | POA: Diagnosis not present

## 2022-06-23 DIAGNOSIS — Z6829 Body mass index (BMI) 29.0-29.9, adult: Secondary | ICD-10-CM | POA: Diagnosis not present

## 2022-06-23 DIAGNOSIS — N183 Chronic kidney disease, stage 3 unspecified: Secondary | ICD-10-CM | POA: Diagnosis not present

## 2022-06-23 DIAGNOSIS — E559 Vitamin D deficiency, unspecified: Secondary | ICD-10-CM | POA: Diagnosis not present

## 2022-06-23 DIAGNOSIS — I129 Hypertensive chronic kidney disease with stage 1 through stage 4 chronic kidney disease, or unspecified chronic kidney disease: Secondary | ICD-10-CM | POA: Diagnosis not present

## 2022-06-23 DIAGNOSIS — S39012A Strain of muscle, fascia and tendon of lower back, initial encounter: Secondary | ICD-10-CM | POA: Diagnosis not present

## 2022-07-18 ENCOUNTER — Ambulatory Visit (INDEPENDENT_AMBULATORY_CARE_PROVIDER_SITE_OTHER): Payer: Medicare HMO

## 2022-07-18 DIAGNOSIS — I4892 Unspecified atrial flutter: Secondary | ICD-10-CM | POA: Diagnosis not present

## 2022-07-19 LAB — CUP PACEART REMOTE DEVICE CHECK
Date Time Interrogation Session: 20240107232052
Implantable Pulse Generator Implant Date: 20220805

## 2022-07-21 NOTE — Progress Notes (Signed)
Carelink Summary Report / Loop Recorder 

## 2022-08-21 LAB — CUP PACEART REMOTE DEVICE CHECK
Date Time Interrogation Session: 20240209231212
Implantable Pulse Generator Implant Date: 20220805

## 2022-08-22 ENCOUNTER — Ambulatory Visit: Payer: Medicare HMO

## 2022-08-22 DIAGNOSIS — I4892 Unspecified atrial flutter: Secondary | ICD-10-CM

## 2022-08-23 NOTE — Progress Notes (Signed)
Carelink Summary Report / Loop Recorder 

## 2022-09-21 ENCOUNTER — Ambulatory Visit (INDEPENDENT_AMBULATORY_CARE_PROVIDER_SITE_OTHER): Payer: Medicare HMO

## 2022-09-21 DIAGNOSIS — I4892 Unspecified atrial flutter: Secondary | ICD-10-CM

## 2022-09-23 LAB — CUP PACEART REMOTE DEVICE CHECK
Date Time Interrogation Session: 20240313231507
Implantable Pulse Generator Implant Date: 20220805

## 2022-10-03 DIAGNOSIS — R69 Illness, unspecified: Secondary | ICD-10-CM | POA: Diagnosis not present

## 2022-10-05 NOTE — Progress Notes (Signed)
Carelink Summary Report / Loop Recorder 

## 2022-10-24 ENCOUNTER — Ambulatory Visit (INDEPENDENT_AMBULATORY_CARE_PROVIDER_SITE_OTHER): Payer: Medicare HMO

## 2022-10-24 DIAGNOSIS — I4892 Unspecified atrial flutter: Secondary | ICD-10-CM

## 2022-10-25 LAB — CUP PACEART REMOTE DEVICE CHECK
Date Time Interrogation Session: 20240414230516
Implantable Pulse Generator Implant Date: 20220805

## 2022-10-26 NOTE — Progress Notes (Signed)
Carelink Summary Report / Loop Recorder 

## 2022-11-07 DIAGNOSIS — R69 Illness, unspecified: Secondary | ICD-10-CM | POA: Diagnosis not present

## 2022-11-28 ENCOUNTER — Ambulatory Visit (INDEPENDENT_AMBULATORY_CARE_PROVIDER_SITE_OTHER): Payer: Medicare HMO

## 2022-11-28 DIAGNOSIS — I4892 Unspecified atrial flutter: Secondary | ICD-10-CM

## 2022-11-28 LAB — CUP PACEART REMOTE DEVICE CHECK
Date Time Interrogation Session: 20240517230450
Implantable Pulse Generator Implant Date: 20220805

## 2022-11-29 NOTE — Progress Notes (Signed)
Carelink Summary Report / Loop Recorder 

## 2022-12-23 DIAGNOSIS — E1142 Type 2 diabetes mellitus with diabetic polyneuropathy: Secondary | ICD-10-CM | POA: Diagnosis not present

## 2022-12-23 DIAGNOSIS — N183 Chronic kidney disease, stage 3 unspecified: Secondary | ICD-10-CM | POA: Diagnosis not present

## 2022-12-23 DIAGNOSIS — E785 Hyperlipidemia, unspecified: Secondary | ICD-10-CM | POA: Diagnosis not present

## 2022-12-23 DIAGNOSIS — E559 Vitamin D deficiency, unspecified: Secondary | ICD-10-CM | POA: Diagnosis not present

## 2022-12-26 DIAGNOSIS — E785 Hyperlipidemia, unspecified: Secondary | ICD-10-CM | POA: Diagnosis not present

## 2022-12-26 DIAGNOSIS — E1142 Type 2 diabetes mellitus with diabetic polyneuropathy: Secondary | ICD-10-CM | POA: Diagnosis not present

## 2022-12-26 DIAGNOSIS — Z6827 Body mass index (BMI) 27.0-27.9, adult: Secondary | ICD-10-CM | POA: Diagnosis not present

## 2022-12-26 DIAGNOSIS — E559 Vitamin D deficiency, unspecified: Secondary | ICD-10-CM | POA: Diagnosis not present

## 2022-12-26 DIAGNOSIS — I129 Hypertensive chronic kidney disease with stage 1 through stage 4 chronic kidney disease, or unspecified chronic kidney disease: Secondary | ICD-10-CM | POA: Diagnosis not present

## 2022-12-26 DIAGNOSIS — N183 Chronic kidney disease, stage 3 unspecified: Secondary | ICD-10-CM | POA: Diagnosis not present

## 2022-12-27 NOTE — Progress Notes (Signed)
Carelink Summary Report / Loop Recorder 

## 2022-12-29 DIAGNOSIS — Z794 Long term (current) use of insulin: Secondary | ICD-10-CM | POA: Diagnosis not present

## 2022-12-29 DIAGNOSIS — E113391 Type 2 diabetes mellitus with moderate nonproliferative diabetic retinopathy without macular edema, right eye: Secondary | ICD-10-CM | POA: Diagnosis not present

## 2022-12-30 DIAGNOSIS — Z0100 Encounter for examination of eyes and vision without abnormal findings: Secondary | ICD-10-CM | POA: Diagnosis not present

## 2023-01-02 ENCOUNTER — Ambulatory Visit: Payer: Medicare HMO

## 2023-01-02 DIAGNOSIS — I4892 Unspecified atrial flutter: Secondary | ICD-10-CM | POA: Diagnosis not present

## 2023-01-02 LAB — CUP PACEART REMOTE DEVICE CHECK
Date Time Interrogation Session: 20240623230909
Implantable Pulse Generator Implant Date: 20220805

## 2023-01-23 NOTE — Progress Notes (Signed)
 Carelink Summary Report / Loop Recorder 

## 2023-01-25 ENCOUNTER — Telehealth: Payer: Self-pay

## 2023-01-25 NOTE — Patient Outreach (Signed)
  Care Coordination   Initial Visit Note   01/25/2023 Name: Bobby Fields MRN: [id number] DOB: [birth year]  Bobby Fields is a 72 y.o. year old male who sees Bobby Fusi, MD for primary care. I spoke with  Bobby Fields by phone today.  What matters to the patients health and wellness today?  Placed call to patient today to review and offer [facility] program.  Patient reports that he is concerned about his DM. Reports A1c up to 8.  States that he is interested in changing medications.  Encouraged patient to call MD office and leave MD a message.  Offer to set up assessment appointment and patient wants to wait until he talks to MD. Provided my contact information and phone number for patient to call me back if his is interested in [facility] program.       SDOH assessments and interventions completed:  No     Care Coordination Interventions:  Yes, provided   Follow up plan: No further intervention required.   Encounter Outcome:  Pt. Visit Completed   Rowe Pavy, RN, BSN, CEN [facility] Coordinator [phone]

## 2023-02-06 ENCOUNTER — Ambulatory Visit (INDEPENDENT_AMBULATORY_CARE_PROVIDER_SITE_OTHER): Payer: Medicare HMO

## 2023-02-06 DIAGNOSIS — I4892 Unspecified atrial flutter: Secondary | ICD-10-CM

## 2023-02-08 ENCOUNTER — Encounter: Payer: Self-pay | Admitting: Cardiology

## 2023-02-08 ENCOUNTER — Ambulatory Visit: Payer: Medicare HMO | Attending: Cardiology | Admitting: Cardiology

## 2023-02-08 VITALS — BP 120/86 | HR 76 | Ht 71.0 in | Wt 206.2 lb

## 2023-02-08 DIAGNOSIS — I4892 Unspecified atrial flutter: Secondary | ICD-10-CM | POA: Diagnosis not present

## 2023-02-08 DIAGNOSIS — I483 Typical atrial flutter: Secondary | ICD-10-CM | POA: Diagnosis not present

## 2023-02-08 NOTE — Progress Notes (Signed)
  Electrophysiology Office Follow up Visit Note:    Date:  02/08/2023   ID:  Bobby Fields, DOB 05-18-1951, MRN [id number]  PCP:  Bobby Fusi, MD  [facility] Cardiologist:  Bobby Schultz, MD  [facility] Electrophysiologist:  Bobby Prude, MD    Interval History:    Bobby Fields is a 72 y.o. male who presents for a follow up visit.   Last seen Nov 30, 2021 for history of atrial flutter.  He has a loop recorder in situ.  He does not take daily anticoagulation.  He does take aspirin 81 mg by mouth daily.  He also has a history of pulmonary embolism.       Past medical, surgical, social and family history were reviewed.  ROS:   Please see the history of present illness.    All other systems reviewed and are negative.  EKGs/Labs/Other Studies Reviewed:    The following studies were reviewed today:         Physical Exam:    VS:  BP 120/86 (BP Location: Left Arm, Patient Position: Sitting, Cuff Size: Large)   Pulse 76   Ht 5\' 11"  (1.803 m)   Wt 206 lb 3.2 oz (93.5 kg)   BMI 28.76 kg/m     Wt Readings from Last 3 Encounters:  02/08/23 206 lb 3.2 oz (93.5 kg)  11/30/21 207 lb (93.9 kg)  02/12/21 210 lb (95.3 kg)     GEN:  Well nourished, well developed in no acute distress CARDIAC: RRR, no murmurs, rubs, gallops RESPIRATORY:  Clear to auscultation without rales, wheezing or rhonchi       ASSESSMENT:    1. Atrial flutter, unspecified type (HCC)   2. Typical atrial flutter (HCC)    PLAN:    In order of problems listed above:   #Typical atrial flutter Maintaining sinus rhythm after his catheter ablation on Nov 09, 2020.  Has a loop recorder in situ for atrial fibrillation surveillance. Continue aspirin 81 mg by mouth once daily  #History of pulmonary embolism Off anticoagulation based on his primary care physician's recommendations.  Follow-up 1 year with APP.   Signed, Bobby Dunn, MD, [facility], [facility] 02/08/2023 3:51 PM     Electrophysiology [facility]

## 2023-02-08 NOTE — Patient Instructions (Signed)
Medication Instructions:  Your physician recommends that you continue on your current medications as directed. Please refer to the Current Medication list given to you today.  *If you need a refill on your cardiac medications before your next appointment, please call your pharmacy*  Follow-Up: At [facility], you and your health needs are our priority.  As part of our continuing mission to provide you with exceptional heart care, we have created designated Provider Care Teams.  These Care Teams include your primary Cardiologist (physician) and Advanced Practice Providers (APPs -  Physician Assistants and Nurse Practitioners) who all work together to provide you with the care you need, when you need it.  Your next appointment:   1 year(s)  Provider:   You may see Lanier Prude, MD or one of the following Advanced Practice Providers on your designated Care Team:   Francis Dowse, [place] [address] [place], [place] Sherie Don, NP Canary Brim, NP

## 2023-02-23 DIAGNOSIS — Z7984 Long term (current) use of oral hypoglycemic drugs: Secondary | ICD-10-CM | POA: Diagnosis not present

## 2023-02-23 DIAGNOSIS — N189 Chronic kidney disease, unspecified: Secondary | ICD-10-CM | POA: Diagnosis not present

## 2023-02-23 DIAGNOSIS — Z008 Encounter for other general examination: Secondary | ICD-10-CM | POA: Diagnosis not present

## 2023-02-23 DIAGNOSIS — I2510 Atherosclerotic heart disease of native coronary artery without angina pectoris: Secondary | ICD-10-CM | POA: Diagnosis not present

## 2023-02-23 DIAGNOSIS — I4892 Unspecified atrial flutter: Secondary | ICD-10-CM | POA: Diagnosis not present

## 2023-02-23 DIAGNOSIS — Z8249 Family history of ischemic heart disease and other diseases of the circulatory system: Secondary | ICD-10-CM | POA: Diagnosis not present

## 2023-02-23 DIAGNOSIS — K219 Gastro-esophageal reflux disease without esophagitis: Secondary | ICD-10-CM | POA: Diagnosis not present

## 2023-02-23 DIAGNOSIS — E1142 Type 2 diabetes mellitus with diabetic polyneuropathy: Secondary | ICD-10-CM | POA: Diagnosis not present

## 2023-02-23 DIAGNOSIS — K589 Irritable bowel syndrome without diarrhea: Secondary | ICD-10-CM | POA: Diagnosis not present

## 2023-02-23 DIAGNOSIS — M1990 Unspecified osteoarthritis, unspecified site: Secondary | ICD-10-CM | POA: Diagnosis not present

## 2023-02-23 DIAGNOSIS — E785 Hyperlipidemia, unspecified: Secondary | ICD-10-CM | POA: Diagnosis not present

## 2023-02-23 DIAGNOSIS — E1122 Type 2 diabetes mellitus with diabetic chronic kidney disease: Secondary | ICD-10-CM | POA: Diagnosis not present

## 2023-02-23 DIAGNOSIS — M810 Age-related osteoporosis without current pathological fracture: Secondary | ICD-10-CM | POA: Diagnosis not present

## 2023-02-23 NOTE — Progress Notes (Signed)
Carelink Summary Report / Loop Recorder 

## 2023-03-08 ENCOUNTER — Ambulatory Visit (INDEPENDENT_AMBULATORY_CARE_PROVIDER_SITE_OTHER): Payer: Medicare HMO

## 2023-03-08 DIAGNOSIS — I4892 Unspecified atrial flutter: Secondary | ICD-10-CM | POA: Diagnosis not present

## 2023-03-09 LAB — CUP PACEART REMOTE DEVICE CHECK
Date Time Interrogation Session: 20240828230830
Implantable Pulse Generator Implant Date: 20220805

## 2023-03-17 NOTE — Progress Notes (Signed)
Carelink Summary Report / Loop Recorder 

## 2023-03-27 DIAGNOSIS — Z9181 History of falling: Secondary | ICD-10-CM | POA: Diagnosis not present

## 2023-03-27 DIAGNOSIS — Z0000 Encounter for general adult medical examination without abnormal findings: Secondary | ICD-10-CM | POA: Diagnosis not present

## 2023-04-10 ENCOUNTER — Ambulatory Visit (INDEPENDENT_AMBULATORY_CARE_PROVIDER_SITE_OTHER): Payer: Medicare HMO

## 2023-04-10 DIAGNOSIS — I4892 Unspecified atrial flutter: Secondary | ICD-10-CM

## 2023-04-11 LAB — CUP PACEART REMOTE DEVICE CHECK
Date Time Interrogation Session: 20240930231252
Implantable Pulse Generator Implant Date: 20220805

## 2023-04-25 NOTE — Progress Notes (Signed)
Carelink Summary Report / Loop Recorder 

## 2023-05-15 ENCOUNTER — Ambulatory Visit (INDEPENDENT_AMBULATORY_CARE_PROVIDER_SITE_OTHER): Payer: Medicare HMO

## 2023-05-15 DIAGNOSIS — I4892 Unspecified atrial flutter: Secondary | ICD-10-CM

## 2023-05-15 DIAGNOSIS — L821 Other seborrheic keratosis: Secondary | ICD-10-CM | POA: Diagnosis not present

## 2023-05-15 DIAGNOSIS — L578 Other skin changes due to chronic exposure to nonionizing radiation: Secondary | ICD-10-CM | POA: Diagnosis not present

## 2023-05-15 DIAGNOSIS — L814 Other melanin hyperpigmentation: Secondary | ICD-10-CM | POA: Diagnosis not present

## 2023-05-15 DIAGNOSIS — L720 Epidermal cyst: Secondary | ICD-10-CM | POA: Diagnosis not present

## 2023-05-15 DIAGNOSIS — D225 Melanocytic nevi of trunk: Secondary | ICD-10-CM | POA: Diagnosis not present

## 2023-05-15 LAB — CUP PACEART REMOTE DEVICE CHECK
Date Time Interrogation Session: 20241102230239
Implantable Pulse Generator Implant Date: 20220805

## 2023-05-18 DIAGNOSIS — Z8719 Personal history of other diseases of the digestive system: Secondary | ICD-10-CM | POA: Diagnosis not present

## 2023-06-06 NOTE — Progress Notes (Signed)
Carelink Summary Report / Loop Recorder 

## 2023-06-16 LAB — CUP PACEART REMOTE DEVICE CHECK
Date Time Interrogation Session: 20241205230407
Implantable Pulse Generator Implant Date: 20220805

## 2023-06-19 ENCOUNTER — Ambulatory Visit: Payer: Medicare HMO

## 2023-06-19 DIAGNOSIS — I4892 Unspecified atrial flutter: Secondary | ICD-10-CM | POA: Diagnosis not present

## 2023-06-27 DIAGNOSIS — E785 Hyperlipidemia, unspecified: Secondary | ICD-10-CM | POA: Diagnosis not present

## 2023-06-27 DIAGNOSIS — E1142 Type 2 diabetes mellitus with diabetic polyneuropathy: Secondary | ICD-10-CM | POA: Diagnosis not present

## 2023-06-27 DIAGNOSIS — N183 Chronic kidney disease, stage 3 unspecified: Secondary | ICD-10-CM | POA: Diagnosis not present

## 2023-06-27 DIAGNOSIS — E559 Vitamin D deficiency, unspecified: Secondary | ICD-10-CM | POA: Diagnosis not present

## 2023-06-27 DIAGNOSIS — Z125 Encounter for screening for malignant neoplasm of prostate: Secondary | ICD-10-CM | POA: Diagnosis not present

## 2023-06-30 DIAGNOSIS — E1142 Type 2 diabetes mellitus with diabetic polyneuropathy: Secondary | ICD-10-CM | POA: Diagnosis not present

## 2023-06-30 DIAGNOSIS — E785 Hyperlipidemia, unspecified: Secondary | ICD-10-CM | POA: Diagnosis not present

## 2023-06-30 DIAGNOSIS — I129 Hypertensive chronic kidney disease with stage 1 through stage 4 chronic kidney disease, or unspecified chronic kidney disease: Secondary | ICD-10-CM | POA: Diagnosis not present

## 2023-06-30 DIAGNOSIS — N529 Male erectile dysfunction, unspecified: Secondary | ICD-10-CM | POA: Diagnosis not present

## 2023-06-30 DIAGNOSIS — N183 Chronic kidney disease, stage 3 unspecified: Secondary | ICD-10-CM | POA: Diagnosis not present

## 2023-06-30 DIAGNOSIS — G720 Drug-induced myopathy: Secondary | ICD-10-CM | POA: Diagnosis not present

## 2023-06-30 DIAGNOSIS — Z6829 Body mass index (BMI) 29.0-29.9, adult: Secondary | ICD-10-CM | POA: Diagnosis not present

## 2023-06-30 DIAGNOSIS — E559 Vitamin D deficiency, unspecified: Secondary | ICD-10-CM | POA: Diagnosis not present

## 2023-07-24 ENCOUNTER — Ambulatory Visit (INDEPENDENT_AMBULATORY_CARE_PROVIDER_SITE_OTHER): Payer: Medicare Other

## 2023-07-24 DIAGNOSIS — I4892 Unspecified atrial flutter: Secondary | ICD-10-CM | POA: Diagnosis not present

## 2023-07-24 LAB — CUP PACEART REMOTE DEVICE CHECK
Date Time Interrogation Session: 20250112230843
Implantable Pulse Generator Implant Date: 20220805

## 2023-07-27 NOTE — Progress Notes (Signed)
Carelink Summary Report / Loop Recorder 

## 2023-08-28 ENCOUNTER — Ambulatory Visit (INDEPENDENT_AMBULATORY_CARE_PROVIDER_SITE_OTHER): Payer: Medicare HMO

## 2023-08-28 DIAGNOSIS — I4892 Unspecified atrial flutter: Secondary | ICD-10-CM | POA: Diagnosis not present

## 2023-08-29 LAB — CUP PACEART REMOTE DEVICE CHECK
Date Time Interrogation Session: 20250216230721
Implantable Pulse Generator Implant Date: 20220805

## 2023-08-30 ENCOUNTER — Encounter: Payer: Self-pay | Admitting: Cardiology

## 2023-09-05 NOTE — Progress Notes (Signed)
 Carelink Summary Report / Loop Recorder

## 2023-10-02 ENCOUNTER — Ambulatory Visit (INDEPENDENT_AMBULATORY_CARE_PROVIDER_SITE_OTHER): Payer: Medicare HMO

## 2023-10-02 DIAGNOSIS — I4892 Unspecified atrial flutter: Secondary | ICD-10-CM | POA: Diagnosis not present

## 2023-10-02 LAB — CUP PACEART REMOTE DEVICE CHECK
Date Time Interrogation Session: 20250323230659
Implantable Pulse Generator Implant Date: 20220805

## 2023-10-05 NOTE — Addendum Note (Signed)
 Addended by: Geralyn Flash D on: 10/05/2023 04:08 PM   Modules accepted: Orders

## 2023-10-05 NOTE — Progress Notes (Signed)
 Carelink Summary Report / Loop Recorder

## 2023-10-07 ENCOUNTER — Encounter: Payer: Self-pay | Admitting: Cardiology

## 2023-11-06 ENCOUNTER — Encounter: Payer: Self-pay | Admitting: Cardiology

## 2023-11-06 ENCOUNTER — Ambulatory Visit (INDEPENDENT_AMBULATORY_CARE_PROVIDER_SITE_OTHER): Payer: Medicare HMO

## 2023-11-06 DIAGNOSIS — I4892 Unspecified atrial flutter: Secondary | ICD-10-CM

## 2023-11-06 LAB — CUP PACEART REMOTE DEVICE CHECK
Date Time Interrogation Session: 20250427230702
Implantable Pulse Generator Implant Date: 20220805

## 2023-11-20 NOTE — Progress Notes (Signed)
 Carelink Summary Report / Loop Recorder

## 2023-12-11 ENCOUNTER — Ambulatory Visit (INDEPENDENT_AMBULATORY_CARE_PROVIDER_SITE_OTHER): Payer: Self-pay

## 2023-12-11 ENCOUNTER — Ambulatory Visit: Payer: Self-pay | Admitting: Cardiology

## 2023-12-11 DIAGNOSIS — I483 Typical atrial flutter: Secondary | ICD-10-CM | POA: Diagnosis not present

## 2023-12-11 LAB — CUP PACEART REMOTE DEVICE CHECK
Date Time Interrogation Session: 20250601231510
Implantable Pulse Generator Implant Date: 20220805

## 2023-12-26 ENCOUNTER — Encounter (HOSPITAL_BASED_OUTPATIENT_CLINIC_OR_DEPARTMENT_OTHER): Payer: Self-pay

## 2023-12-26 ENCOUNTER — Ambulatory Visit (HOSPITAL_BASED_OUTPATIENT_CLINIC_OR_DEPARTMENT_OTHER)
Admit: 2023-12-26 | Discharge: 2023-12-26 | Disposition: A | Discharge status: Home / Self Care | Attending: Family Medicine | Admitting: Family Medicine

## 2023-12-26 ENCOUNTER — Ambulatory Visit (HOSPITAL_BASED_OUTPATIENT_CLINIC_OR_DEPARTMENT_OTHER)
Admission: RE | Admit: 2023-12-26 | Discharge: 2023-12-26 | Disposition: A | Discharge status: Home / Self Care | Source: Ambulatory Visit | Attending: Family Medicine | Admitting: Family Medicine

## 2023-12-26 VITALS — BP 135/88 | HR 82 | Temp 98.3°F | Resp 18

## 2023-12-26 DIAGNOSIS — Z886 Allergy status to analgesic agent status: Secondary | ICD-10-CM | POA: Diagnosis not present

## 2023-12-26 DIAGNOSIS — E119 Type 2 diabetes mellitus without complications: Secondary | ICD-10-CM | POA: Diagnosis not present

## 2023-12-26 DIAGNOSIS — R197 Diarrhea, unspecified: Secondary | ICD-10-CM | POA: Diagnosis not present

## 2023-12-26 DIAGNOSIS — R1110 Vomiting, unspecified: Secondary | ICD-10-CM | POA: Diagnosis not present

## 2023-12-26 DIAGNOSIS — Z7984 Long term (current) use of oral hypoglycemic drugs: Secondary | ICD-10-CM | POA: Diagnosis not present

## 2023-12-26 DIAGNOSIS — E785 Hyperlipidemia, unspecified: Secondary | ICD-10-CM | POA: Diagnosis not present

## 2023-12-26 DIAGNOSIS — Z7982 Long term (current) use of aspirin: Secondary | ICD-10-CM | POA: Diagnosis not present

## 2023-12-26 DIAGNOSIS — K760 Fatty (change of) liver, not elsewhere classified: Secondary | ICD-10-CM | POA: Diagnosis not present

## 2023-12-26 DIAGNOSIS — E1142 Type 2 diabetes mellitus with diabetic polyneuropathy: Secondary | ICD-10-CM | POA: Diagnosis not present

## 2023-12-26 DIAGNOSIS — K6389 Other specified diseases of intestine: Secondary | ICD-10-CM | POA: Diagnosis not present

## 2023-12-26 DIAGNOSIS — Z9049 Acquired absence of other specified parts of digestive tract: Secondary | ICD-10-CM | POA: Diagnosis not present

## 2023-12-26 DIAGNOSIS — N183 Chronic kidney disease, stage 3 unspecified: Secondary | ICD-10-CM | POA: Diagnosis not present

## 2023-12-26 DIAGNOSIS — R1084 Generalized abdominal pain: Secondary | ICD-10-CM

## 2023-12-26 DIAGNOSIS — K56609 Unspecified intestinal obstruction, unspecified as to partial versus complete obstruction: Secondary | ICD-10-CM

## 2023-12-26 DIAGNOSIS — K5190 Ulcerative colitis, unspecified, without complications: Secondary | ICD-10-CM | POA: Diagnosis not present

## 2023-12-26 DIAGNOSIS — R140 Abdominal distension (gaseous): Secondary | ICD-10-CM | POA: Diagnosis not present

## 2023-12-26 DIAGNOSIS — R109 Unspecified abdominal pain: Secondary | ICD-10-CM | POA: Diagnosis not present

## 2023-12-26 DIAGNOSIS — E559 Vitamin D deficiency, unspecified: Secondary | ICD-10-CM | POA: Diagnosis not present

## 2023-12-26 DIAGNOSIS — R112 Nausea with vomiting, unspecified: Secondary | ICD-10-CM | POA: Diagnosis not present

## 2023-12-26 HISTORY — DX: Unspecified intestinal obstruction, unspecified as to partial versus complete obstruction: K56.609

## 2023-12-26 MED ORDER — ONDANSETRON HCL 4 MG/2ML IJ SOLN
4.0000 mg | Freq: Once | INTRAMUSCULAR | Status: AC
  Administered 2023-12-26: 4 mg via INTRAMUSCULAR

## 2023-12-26 MED ORDER — ONDANSETRON HCL 4 MG/2ML IJ SOLN: 4.0000 mg | Freq: Once | INTRAMUSCULAR | Status: DC

## 2023-12-26 NOTE — Discharge Instructions (Signed)
 You have a bowel obstruction. Please go to the ER

## 2023-12-26 NOTE — ED Notes (Signed)
 Pt vomited large amount.  St's abd feels better and nausea has subsided at this time

## 2023-12-26 NOTE — ED Notes (Signed)
 Offered pt Zofran  injection.  Pt st's he is not nauseate at this time.  Instructed pt to inform this RN if nausea returns.

## 2023-12-26 NOTE — ED Provider Notes (Signed)
 [facility]    CSN: [id number] Arrival date & time: 12/26/23  1546      History   Chief Complaint Chief Complaint  Patient presents with   Nausea    Bloated, watery, stools, no appetite, feel sick when drinking water burping. Stomach feels tight, sensitive lots of pressure. - Entered by patient   Abdominal Pain    HPI Cline Draheim is a 73 y.o. male.   73 year old male presents today with generalized abdominal pain, nausea, vomiting, watery stools.  This been present for the past 3 days.  Patient has some abdomen distention and tenderness with palpation.  He has been unable to eat due to symptoms.  No fevers, chills.  Patient with a past medical history of bowel obstruction and feels similar.   Abdominal Pain   Past Medical History:  Diagnosis Date   Atrial flutter (HCC)    Bowel obstruction (HCC)    Diabetes mellitus without complication (HCC)    High cholesterol    Hypertension     Patient Active Problem List   Diagnosis Date Noted   Secondary hypercoagulable state (HCC) 11/16/2020   Nonspecific chest pain    Atrial flutter (HCC) 08/13/2020    Past Surgical History:  Procedure Laterality Date   A-FLUTTER ABLATION N/A 11/09/2020   Procedure: A-FLUTTER ABLATION;  Surgeon: Boyce Byes, MD;  Location: [facility] INVASIVE CV LAB;  Service: Cardiovascular;  Laterality: N/A;   IR ANGIOGRAM PULMONARY BILATERAL SELECTIVE  08/15/2020   IR ANGIOGRAM SELECTIVE EACH ADDITIONAL VESSEL  08/15/2020   IR ANGIOGRAM SELECTIVE EACH ADDITIONAL VESSEL  08/15/2020   IR INFUSION THROMBOL ARTERIAL INITIAL (MS)  08/15/2020   IR INFUSION THROMBOL ARTERIAL INITIAL (MS)  08/15/2020   IR THROMB F/U EVAL ART/VEN FINAL DAY (MS)  08/16/2020       Home Medications    Prior to Admission medications   Medication Sig Start Date End Date Taking? Authorizing Provider  acetaminophen  (TYLENOL ) 500 MG tablet Take 500-1,000 mg by mouth every 6 (six) hours as needed (pain).    [provider]   aspirin EC 81 MG tablet  09/07/21   [provider]  BD PEN NEEDLE NANO U/F 32G X 4 MM MISC Once per day 10/08/20   [provider]  Calcium Carb-Cholecalciferol 600-800 MG-UNIT TABS Take 1 tablet by mouth 2 (two) times daily.    [provider]  cetirizine (ZYRTEC) 10 MG tablet Take 10 mg by mouth daily.    [provider]  ezetimibe  (ZETIA ) 10 MG tablet Take 10 mg by mouth in the morning. 07/07/20   [provider]  glimepiride (AMARYL) 4 MG tablet Take 4 mg by mouth in the morning.    [provider]  JANUVIA 100 MG tablet Take 100 mg by mouth in the morning. 07/16/20   [provider]  Lancets (ONETOUCH DELICA PLUS [place]) MISC Apply topically daily. 10/14/20   [provider]  lisinopril (ZESTRIL) 5 MG tablet Take 5 mg by mouth at bedtime. 07/07/20   [provider]  omeprazole (PRILOSEC) 20 MG capsule Take 20 mg by mouth in the morning. 07/07/20   [provider]  [facility] VERIO test strip 3 (three) times daily. 10/14/20   [provider]  pravastatin  (PRAVACHOL ) 20 MG tablet Take 20 mg by mouth 2 (two) times a week. 07/07/20   [provider]  TRESIBA FLEXTOUCH 100 UNIT/ML FlexTouch Pen Inject 30 Units into the skin at bedtime. 07/07/20   [provider]    Family History No family history on file.  Social History Social History   Tobacco Use   Smoking status: Never   Smokeless tobacco: Never  Substance Use Topics   Alcohol use: Yes    Alcohol/week: 1.0 standard drink of alcohol    Types: 1 Cans of beer per week    Comment: social   Drug use: Never     Allergies   Indomethacin   Review of Systems Review of Systems  Gastrointestinal:  Positive for abdominal pain.   See HPI  Physical Exam Triage Vital Signs ED Triage Vitals  Encounter Vitals Group     BP 12/26/23 1606 135/88     Girls Systolic BP Percentile --      Girls Diastolic BP Percentile --       Boys Systolic BP Percentile --      Boys Diastolic BP Percentile --      Pulse Rate 12/26/23 1606 82     Resp 12/26/23 1606 18     Temp 12/26/23 1606 98.3 F (36.8 C)     Temp Source 12/26/23 1606 Oral     SpO2 12/26/23 1606 94 %     Weight --      Height --      Head Circumference --      Peak Flow --      Pain Score 12/26/23 1611 5     Pain Loc --      Pain Education --      Exclude from Growth Chart --    No data found.  Updated Vital Signs BP 135/88 (BP Location: Right Arm)   Pulse 82   Temp 98.3 F (36.8 C) (Oral)   Resp 18   SpO2 94%   Visual Acuity Right Eye Distance:   Left Eye Distance:   Bilateral Distance:    Right Eye Near:   Left Eye Near:    Bilateral Near:     Physical Exam Constitutional:      General: He is not in acute distress.    Appearance: He is ill-appearing. He is not toxic-appearing.   Cardiovascular:     Rate and Rhythm: Normal rate and regular rhythm.  Pulmonary:     Effort: Pulmonary effort is normal.     Breath sounds: Normal breath sounds.  Abdominal:     General: Bowel sounds are decreased.     Palpations: Abdomen is soft.     Tenderness: There is generalized abdominal tenderness.     Hernia: A hernia is present.   Skin:    General: Skin is warm and dry.   Neurological:     Mental Status: He is alert.   Psychiatric:        Mood and Affect: Mood normal.      UC Treatments / Results  Labs (all labs ordered are listed, but only abnormal results are displayed) Labs Reviewed - No data to display  EKG   Radiology DG Abdomen 1 View Result Date: 12/26/2023 CLINICAL DATA:  Abdominal pain with distention. History of bowel obstruction. EXAM: ABDOMEN - 1 VIEW COMPARISON:  02/02/2015. FINDINGS: Dilated air-filled loops of small bowel measuring up to 5.7 cm in diameter with paucity of air within the colon, concerning for small bowel obstruction. No evidence of free air. No radio-opaque calculi. Surgical staples from  rectosigmoid anastomosis project over the pelvis. No acute osseous abnormality. IMPRESSION: Dilated air-filled loops of small bowel measuring up to 5.7 cm in diameter with  paucity of air within the colon, concerning for small bowel obstruction. Electronically Signed   By: Mannie Seek M.D.   On: 12/26/2023 17:08    Procedures Procedures (including critical care time)  Medications Ordered in UC Medications  ondansetron  (ZOFRAN ) injection 4 mg (4 mg Intramuscular Given 12/26/23 1713)    Initial Impression / Assessment and Plan / UC Course  I have reviewed the triage vital signs and the nursing notes.  Pertinent labs & imaging results that were available during my care of the patient were reviewed by me and considered in my medical decision making (see chart for details).     Intestinal obstruction-patient with positive dilated air-filled loops of small bowel measuring up to 5.7 cm in diameter with paucity of air within the colon concerning for small bowel obstruction.  Patient did vomit 1 time here in clinic and did feel better after that.  Zofran  given here IM.  Recommendations are to go to the ER for further management of this.  Patient's wife tried to call his surgeon but was able to get in contact anybody.  She will go to the ER in [place] where his surgeon is at. Final Clinical Impressions(s) / UC Diagnoses   Final diagnoses:  Generalized abdominal pain  Intestinal obstruction, unspecified cause, unspecified whether partial or complete [facility])     Discharge Instructions      You have a bowel obstruction. Please go to the ER     ED Prescriptions   None    PDMP not reviewed this encounter.   Landa Pine, FNP 12/26/23 [phone]

## 2023-12-26 NOTE — ED Triage Notes (Signed)
 Pt c/o generalized abd pain with nausea and watery stool x's 3 days.  Pt st's abd is distended and sore.  Unable to eat, loss of apptite.  Pt has hx of bowel obstruction and st's symptoms feel the same.  Hyperactive bowel sounds in all four quads.

## 2023-12-26 NOTE — ED Notes (Signed)
 Patient is being discharged from the Urgent Care and sent to the Emergency Department via pov . Per T.Bast FNP, patient is in need of higher level of care due to Bowel Obstruction. Patient is aware and verbalizes understanding of plan of care.  Vitals:   12/26/23 1606  BP: 135/88  Pulse: 82  Resp: 18  Temp: 98.3 F (36.8 C)  SpO2: 94%

## 2023-12-27 DIAGNOSIS — R140 Abdominal distension (gaseous): Secondary | ICD-10-CM | POA: Diagnosis not present

## 2023-12-27 DIAGNOSIS — E785 Hyperlipidemia, unspecified: Secondary | ICD-10-CM | POA: Diagnosis not present

## 2023-12-27 DIAGNOSIS — K5190 Ulcerative colitis, unspecified, without complications: Secondary | ICD-10-CM | POA: Diagnosis not present

## 2023-12-27 DIAGNOSIS — Z8719 Personal history of other diseases of the digestive system: Secondary | ICD-10-CM | POA: Diagnosis not present

## 2023-12-27 DIAGNOSIS — K5100 Ulcerative (chronic) pancolitis without complications: Secondary | ICD-10-CM | POA: Diagnosis not present

## 2023-12-27 DIAGNOSIS — Z980 Intestinal bypass and anastomosis status: Secondary | ICD-10-CM | POA: Diagnosis not present

## 2023-12-27 DIAGNOSIS — E1169 Type 2 diabetes mellitus with other specified complication: Secondary | ICD-10-CM | POA: Diagnosis not present

## 2023-12-27 DIAGNOSIS — Z7982 Long term (current) use of aspirin: Secondary | ICD-10-CM | POA: Diagnosis not present

## 2023-12-27 DIAGNOSIS — R112 Nausea with vomiting, unspecified: Secondary | ICD-10-CM | POA: Diagnosis not present

## 2023-12-27 DIAGNOSIS — E119 Type 2 diabetes mellitus without complications: Secondary | ICD-10-CM | POA: Diagnosis not present

## 2023-12-27 DIAGNOSIS — K50918 Crohn's disease, unspecified, with other complication: Secondary | ICD-10-CM | POA: Diagnosis not present

## 2023-12-27 DIAGNOSIS — I10 Essential (primary) hypertension: Secondary | ICD-10-CM | POA: Diagnosis not present

## 2023-12-27 DIAGNOSIS — K5989 Other specified functional intestinal disorders: Secondary | ICD-10-CM | POA: Diagnosis not present

## 2023-12-27 DIAGNOSIS — Z7901 Long term (current) use of anticoagulants: Secondary | ICD-10-CM | POA: Diagnosis not present

## 2023-12-27 DIAGNOSIS — R1084 Generalized abdominal pain: Secondary | ICD-10-CM | POA: Diagnosis not present

## 2023-12-27 DIAGNOSIS — R197 Diarrhea, unspecified: Secondary | ICD-10-CM | POA: Diagnosis not present

## 2023-12-27 DIAGNOSIS — R933 Abnormal findings on diagnostic imaging of other parts of digestive tract: Secondary | ICD-10-CM | POA: Diagnosis not present

## 2023-12-27 NOTE — Progress Notes (Signed)
 Carelink Summary Report / Loop Recorder

## 2023-12-31 DIAGNOSIS — K5100 Ulcerative (chronic) pancolitis without complications: Secondary | ICD-10-CM | POA: Diagnosis not present

## 2024-01-03 DIAGNOSIS — K56609 Unspecified intestinal obstruction, unspecified as to partial versus complete obstruction: Secondary | ICD-10-CM | POA: Diagnosis not present

## 2024-01-05 DIAGNOSIS — T466X5A Adverse effect of antihyperlipidemic and antiarteriosclerotic drugs, initial encounter: Secondary | ICD-10-CM | POA: Diagnosis not present

## 2024-01-05 DIAGNOSIS — K56600 Partial intestinal obstruction, unspecified as to cause: Secondary | ICD-10-CM | POA: Diagnosis not present

## 2024-01-05 DIAGNOSIS — G720 Drug-induced myopathy: Secondary | ICD-10-CM | POA: Diagnosis not present

## 2024-01-05 DIAGNOSIS — N183 Chronic kidney disease, stage 3 unspecified: Secondary | ICD-10-CM | POA: Diagnosis not present

## 2024-01-05 DIAGNOSIS — K5190 Ulcerative colitis, unspecified, without complications: Secondary | ICD-10-CM | POA: Diagnosis not present

## 2024-01-05 DIAGNOSIS — E559 Vitamin D deficiency, unspecified: Secondary | ICD-10-CM | POA: Diagnosis not present

## 2024-01-05 DIAGNOSIS — E1142 Type 2 diabetes mellitus with diabetic polyneuropathy: Secondary | ICD-10-CM | POA: Diagnosis not present

## 2024-01-05 DIAGNOSIS — I129 Hypertensive chronic kidney disease with stage 1 through stage 4 chronic kidney disease, or unspecified chronic kidney disease: Secondary | ICD-10-CM | POA: Diagnosis not present

## 2024-01-05 DIAGNOSIS — E785 Hyperlipidemia, unspecified: Secondary | ICD-10-CM | POA: Diagnosis not present

## 2024-01-11 ENCOUNTER — Ambulatory Visit (INDEPENDENT_AMBULATORY_CARE_PROVIDER_SITE_OTHER): Payer: Self-pay

## 2024-01-11 DIAGNOSIS — I483 Typical atrial flutter: Secondary | ICD-10-CM | POA: Diagnosis not present

## 2024-01-11 LAB — CUP PACEART REMOTE DEVICE CHECK
Date Time Interrogation Session: 20250702231433
Implantable Pulse Generator Implant Date: 20220805

## 2024-01-20 ENCOUNTER — Ambulatory Visit: Payer: Self-pay | Admitting: Cardiology

## 2024-01-30 NOTE — Progress Notes (Signed)
 Carelink Summary Report / Loop Recorder

## 2024-02-12 ENCOUNTER — Ambulatory Visit: Payer: Self-pay

## 2024-02-12 DIAGNOSIS — I483 Typical atrial flutter: Secondary | ICD-10-CM

## 2024-02-13 LAB — CUP PACEART REMOTE DEVICE CHECK
Date Time Interrogation Session: 20250802230725
Implantable Pulse Generator Implant Date: 20220805

## 2024-02-14 ENCOUNTER — Ambulatory Visit: Payer: Self-pay | Admitting: Cardiology

## 2024-03-14 ENCOUNTER — Ambulatory Visit (INDEPENDENT_AMBULATORY_CARE_PROVIDER_SITE_OTHER): Payer: Self-pay

## 2024-03-14 DIAGNOSIS — I483 Typical atrial flutter: Secondary | ICD-10-CM

## 2024-03-14 LAB — CUP PACEART REMOTE DEVICE CHECK
Date Time Interrogation Session: 20250903231206
Implantable Pulse Generator Implant Date: 20220805

## 2024-03-16 ENCOUNTER — Ambulatory Visit: Payer: Self-pay | Admitting: Cardiology

## 2024-03-25 NOTE — Progress Notes (Signed)
 Remote Loop Recorder Transmission

## 2024-03-27 DIAGNOSIS — Z0000 Encounter for general adult medical examination without abnormal findings: Secondary | ICD-10-CM | POA: Diagnosis not present

## 2024-03-27 DIAGNOSIS — Z9181 History of falling: Secondary | ICD-10-CM | POA: Diagnosis not present

## 2024-04-08 NOTE — Progress Notes (Signed)
 Carelink Summary Report / Loop Recorder

## 2024-04-08 NOTE — Progress Notes (Signed)
 Remote Loop Recorder Transmission

## 2024-04-15 ENCOUNTER — Ambulatory Visit (INDEPENDENT_AMBULATORY_CARE_PROVIDER_SITE_OTHER): Payer: Self-pay

## 2024-04-15 DIAGNOSIS — I483 Typical atrial flutter: Secondary | ICD-10-CM

## 2024-04-15 LAB — CUP PACEART REMOTE DEVICE CHECK
Date Time Interrogation Session: 20251005231236
Implantable Pulse Generator Implant Date: 20220805

## 2024-04-16 NOTE — Progress Notes (Signed)
 Remote Loop Recorder Transmission

## 2024-04-17 ENCOUNTER — Ambulatory Visit: Payer: Self-pay | Admitting: Cardiology

## 2024-05-16 ENCOUNTER — Ambulatory Visit (INDEPENDENT_AMBULATORY_CARE_PROVIDER_SITE_OTHER): Payer: Self-pay

## 2024-05-16 DIAGNOSIS — I483 Typical atrial flutter: Secondary | ICD-10-CM

## 2024-05-16 LAB — CUP PACEART REMOTE DEVICE CHECK
Date Time Interrogation Session: 20251105231519
Implantable Pulse Generator Implant Date: 20220805

## 2024-05-20 ENCOUNTER — Ambulatory Visit: Payer: Self-pay | Admitting: Cardiology

## 2024-05-20 NOTE — Progress Notes (Signed)
 Remote Loop Recorder Transmission

## 2024-06-16 ENCOUNTER — Ambulatory Visit: Payer: Self-pay

## 2024-06-16 NOTE — Progress Notes (Unsigned)
  Electrophysiology Office Follow up Visit Note:    Date:  06/17/2024   ID:  Bobby Fields, DOB 08/17/1950, MRN [id number]  PCP:  Keren Vicenta BRAVO, MD  [facility] Cardiologist:  Oneil Parchment, MD  [facility] Electrophysiologist:  OLE ONEIDA HOLTS, MD    Interval History:     Bobby Fields is a 73 y.o. male who presents for a follow up visit.   I last saw the patient February 08, 2023 for history of atrial flutter.  He has a loop recorder.  He had a catheter ablation for his arrhythmia in May 2022.  He takes aspirin 81 mg by mouth. He is doing well today.  No arrhythmia complaints.  He is with his wife in clinic.       Past medical, surgical, social and family history were reviewed.  ROS:   Please see the history of present illness.    All other systems reviewed and are negative.  EKGs/Labs/Other Studies Reviewed:    The following studies were reviewed today:     EKG Interpretation Date/Time:  Monday June 17 2024 09:38:05 EST Ventricular Rate:  67 PR Interval:  266 QRS Duration:  80 QT Interval:  366 QTC Calculation: 386 R Axis:   77  Text Interpretation: Sinus rhythm with 1st degree A-V block Confirmed by Holts Ole [phone]) on 06/17/2024 9:44:42 AM    Physical Exam:    VS:  BP (!) 140/86 (BP Location: Left Arm, Patient Position: Sitting, Cuff Size: Large)   Pulse 67   Ht 5' 11 (1.803 m)   Wt 204 lb (92.5 kg)   SpO2 97%   BMI 28.45 kg/m     Wt Readings from Last 3 Encounters:  06/17/24 204 lb (92.5 kg)  02/08/23 206 lb 3.2 oz (93.5 kg)  11/30/21 207 lb (93.9 kg)     GEN: no distress CARD: RRR, No MRG RESP: No IWOB. CTAB.      ASSESSMENT:    1. Typical atrial flutter (HCC)   2. History of pulmonary embolism    PLAN:    In order of problems listed above:  #History of atrial flutter Typical atrial flutter ablation Nov 09, 2020.  Has had no sustained arrhythmias since his catheter ablation Continue aspirin 81 mg by mouth once  daily  #History of PE Off anticoagulation based on primary care physicians recommendations  I discussed my upcoming departure from [facility] during today's clinic appointment.  The patient will continue to follow-up with one of my EP partners moving forward.  Follow-up with EP APP in 1 year    Signed, Ole Holts, MD, [facility], [facility] 06/17/2024 9:51 AM    Electrophysiology [facility]

## 2024-06-17 ENCOUNTER — Encounter: Payer: Self-pay | Admitting: Cardiology

## 2024-06-17 ENCOUNTER — Ambulatory Visit: Attending: Cardiology | Admitting: Cardiology

## 2024-06-17 VITALS — BP 140/86 | HR 67 | Ht 71.0 in | Wt 204.0 lb

## 2024-06-17 DIAGNOSIS — I483 Typical atrial flutter: Secondary | ICD-10-CM | POA: Diagnosis not present

## 2024-06-17 DIAGNOSIS — Z86711 Personal history of pulmonary embolism: Secondary | ICD-10-CM | POA: Diagnosis not present

## 2024-06-17 NOTE — Patient Instructions (Signed)
 Medication Instructions:  Your physician recommends that you continue on your current medications as directed. Please refer to the Current Medication list given to you today.  *If you need a refill on your cardiac medications before your next appointment, please call your pharmacy*  Follow-Up: At [facility], you and your health needs are our priority.  As part of our continuing mission to provide you with exceptional heart care, our providers are all part of one team.  This team includes your primary Cardiologist (physician) and Advanced Practice Providers or APPs (Physician Assistants and Nurse Practitioners) who all work together to provide you with the care you need, when you need it.  Your next appointment:   1 year  Provider:   Soyla Norton, MD

## 2024-06-18 LAB — CUP PACEART REMOTE DEVICE CHECK
Date Time Interrogation Session: 20251206230551
Implantable Pulse Generator Implant Date: 20220805

## 2024-06-19 ENCOUNTER — Ambulatory Visit: Payer: Self-pay | Admitting: Cardiology

## 2024-06-25 NOTE — Progress Notes (Signed)
 Remote Loop Recorder Transmission

## 2024-07-17 ENCOUNTER — Ambulatory Visit: Payer: Self-pay

## 2024-07-17 DIAGNOSIS — I4892 Unspecified atrial flutter: Secondary | ICD-10-CM

## 2024-07-18 ENCOUNTER — Ambulatory Visit: Payer: Self-pay | Admitting: Cardiology

## 2024-07-18 LAB — CUP PACEART REMOTE DEVICE CHECK
Date Time Interrogation Session: 20260106230846
Implantable Pulse Generator Implant Date: 20220805

## 2024-07-22 NOTE — Progress Notes (Signed)
 Remote Loop Recorder Transmission

## 2024-07-24 NOTE — Progress Notes (Signed)
 Bobby Fields                                          MRN: [id number]   07/24/2024   The VBCI Quality Team Specialist reviewed this patient medical record for the purposes of chart review for care gap closure. The following were reviewed: abstraction for care gap closure-glycemic status assessment.    VBCI Quality Team
